# Patient Record
Sex: Male | Born: 1945 | Race: White | Hispanic: No | Marital: Married | Smoking: Former smoker
Health system: Southern US, Community
[De-identification: ages and names within clinical notes are randomized; demographics above are authoritative.]

## PROBLEM LIST (undated history)

## (undated) DIAGNOSIS — Z8673 Personal history of transient ischemic attack (TIA), and cerebral infarction without residual deficits: Secondary | ICD-10-CM

## (undated) DIAGNOSIS — I639 Cerebral infarction, unspecified: Secondary | ICD-10-CM

## (undated) DIAGNOSIS — I872 Venous insufficiency (chronic) (peripheral): Secondary | ICD-10-CM

## (undated) DIAGNOSIS — R011 Cardiac murmur, unspecified: Secondary | ICD-10-CM

## (undated) DIAGNOSIS — L309 Dermatitis, unspecified: Secondary | ICD-10-CM

## (undated) DIAGNOSIS — I491 Atrial premature depolarization: Secondary | ICD-10-CM

## (undated) DIAGNOSIS — E785 Hyperlipidemia, unspecified: Secondary | ICD-10-CM

## (undated) DIAGNOSIS — E119 Type 2 diabetes mellitus without complications: Secondary | ICD-10-CM

## (undated) DIAGNOSIS — I779 Disorder of arteries and arterioles, unspecified: Secondary | ICD-10-CM

## (undated) DIAGNOSIS — I10 Essential (primary) hypertension: Secondary | ICD-10-CM

## (undated) DIAGNOSIS — R609 Edema, unspecified: Secondary | ICD-10-CM

## (undated) DIAGNOSIS — G4700 Insomnia, unspecified: Secondary | ICD-10-CM

## (undated) HISTORY — DX: Cerebral infarction, unspecified: I63.9

## (undated) HISTORY — DX: Atrial premature depolarization: I49.1

## (undated) HISTORY — DX: Type 2 diabetes mellitus without complications: E11.9

## (undated) HISTORY — DX: Edema, unspecified: R60.9

## (undated) HISTORY — DX: Personal history of transient ischemic attack (TIA), and cerebral infarction without residual deficits: Z86.73

## (undated) HISTORY — DX: Dermatitis, unspecified: L30.9

## (undated) HISTORY — DX: Venous insufficiency (chronic) (peripheral): I87.2

## (undated) HISTORY — DX: Disorder of arteries and arterioles, unspecified: I77.9

## (undated) HISTORY — DX: Insomnia, unspecified: G47.00

## (undated) HISTORY — DX: Hyperlipidemia, unspecified: E78.5

## (undated) HISTORY — DX: Essential (primary) hypertension: I10

## (undated) HISTORY — DX: Cardiac murmur, unspecified: R01.1

---

## 1983-08-07 HISTORY — PX: GALLBLADDER SURGERY: SHX652

## 2013-09-10 ENCOUNTER — Encounter (HOSPITAL_COMMUNITY): Payer: Self-pay | Admitting: Emergency Medicine

## 2013-09-10 ENCOUNTER — Emergency Department (INDEPENDENT_AMBULATORY_CARE_PROVIDER_SITE_OTHER): Payer: Self-pay

## 2013-09-10 ENCOUNTER — Emergency Department (INDEPENDENT_AMBULATORY_CARE_PROVIDER_SITE_OTHER)
Admission: EM | Admit: 2013-09-10 | Discharge: 2013-09-10 | Disposition: A | Discharge status: Home / Self Care | Payer: Self-pay | Source: Home / Self Care | Attending: Family Medicine | Admitting: Family Medicine

## 2013-09-10 DIAGNOSIS — S139XXA Sprain of joints and ligaments of unspecified parts of neck, initial encounter: Secondary | ICD-10-CM

## 2013-09-10 DIAGNOSIS — S134XXA Sprain of ligaments of cervical spine, initial encounter: Secondary | ICD-10-CM

## 2013-09-10 MED ORDER — TRAMADOL HCL 50 MG PO TABS: 50.0000 mg | ORAL_TABLET | Freq: Four times a day (QID) | ORAL | Status: DC | PRN
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
## 2015-01-05 DIAGNOSIS — L309 Dermatitis, unspecified: Secondary | ICD-10-CM | POA: Insufficient documentation

## 2015-01-05 DIAGNOSIS — N138 Other obstructive and reflux uropathy: Secondary | ICD-10-CM | POA: Insufficient documentation

## 2015-01-05 DIAGNOSIS — I10 Essential (primary) hypertension: Secondary | ICD-10-CM | POA: Insufficient documentation

## 2015-01-05 DIAGNOSIS — E782 Mixed hyperlipidemia: Secondary | ICD-10-CM | POA: Insufficient documentation

## 2015-01-05 DIAGNOSIS — K219 Gastro-esophageal reflux disease without esophagitis: Secondary | ICD-10-CM | POA: Insufficient documentation

## 2015-01-05 DIAGNOSIS — R609 Edema, unspecified: Secondary | ICD-10-CM | POA: Insufficient documentation
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
## 2015-04-06 DIAGNOSIS — E782 Mixed hyperlipidemia: Secondary | ICD-10-CM | POA: Diagnosis not present

## 2015-04-06 DIAGNOSIS — E780 Pure hypercholesterolemia: Secondary | ICD-10-CM | POA: Diagnosis not present

## 2015-04-06 DIAGNOSIS — D519 Vitamin B12 deficiency anemia, unspecified: Secondary | ICD-10-CM | POA: Diagnosis not present

## 2015-04-06 DIAGNOSIS — R739 Hyperglycemia, unspecified: Secondary | ICD-10-CM | POA: Diagnosis not present

## 2015-04-06 DIAGNOSIS — I10 Essential (primary) hypertension: Secondary | ICD-10-CM | POA: Diagnosis not present

## 2015-04-06 DIAGNOSIS — I491 Atrial premature depolarization: Secondary | ICD-10-CM | POA: Diagnosis not present

## 2015-04-13 DIAGNOSIS — B356 Tinea cruris: Secondary | ICD-10-CM | POA: Diagnosis not present

## 2015-04-13 DIAGNOSIS — I491 Atrial premature depolarization: Secondary | ICD-10-CM | POA: Diagnosis not present

## 2015-04-13 DIAGNOSIS — E780 Pure hypercholesterolemia: Secondary | ICD-10-CM | POA: Diagnosis not present

## 2015-04-13 DIAGNOSIS — Z0000 Encounter for general adult medical examination without abnormal findings: Secondary | ICD-10-CM | POA: Diagnosis not present

## 2015-04-13 DIAGNOSIS — I10 Essential (primary) hypertension: Secondary | ICD-10-CM | POA: Diagnosis not present

## 2015-04-13 DIAGNOSIS — R739 Hyperglycemia, unspecified: Secondary | ICD-10-CM | POA: Diagnosis not present

## 2015-04-13 DIAGNOSIS — Z23 Encounter for immunization: Secondary | ICD-10-CM | POA: Diagnosis not present

## 2015-05-09 DIAGNOSIS — B86 Scabies: Secondary | ICD-10-CM | POA: Diagnosis not present

## 2015-05-19 DIAGNOSIS — L309 Dermatitis, unspecified: Secondary | ICD-10-CM | POA: Diagnosis not present

## 2015-07-21 DIAGNOSIS — L309 Dermatitis, unspecified: Secondary | ICD-10-CM | POA: Diagnosis not present

## 2015-07-21 DIAGNOSIS — L304 Erythema intertrigo: Secondary | ICD-10-CM | POA: Diagnosis not present

## 2017-02-13 ENCOUNTER — Encounter: Payer: Self-pay | Admitting: Cardiovascular Disease

## 2017-02-13 ENCOUNTER — Ambulatory Visit (INDEPENDENT_AMBULATORY_CARE_PROVIDER_SITE_OTHER): Payer: Medicare Other | Admitting: Cardiovascular Disease

## 2017-02-13 VITALS — BP 140/48 | HR 58 | Ht 73.0 in | Wt 168.0 lb

## 2017-02-13 DIAGNOSIS — E7800 Pure hypercholesterolemia, unspecified: Secondary | ICD-10-CM

## 2017-02-13 DIAGNOSIS — R600 Localized edema: Secondary | ICD-10-CM

## 2017-02-13 DIAGNOSIS — I10 Essential (primary) hypertension: Secondary | ICD-10-CM | POA: Diagnosis not present

## 2017-02-13 MED ORDER — CHLORTHALIDONE 25 MG PO TABS: 25.0000 mg | ORAL_TABLET | Freq: Every day | ORAL | 6 refills | Status: DC

## 2017-02-13 NOTE — Progress Notes (Signed)
CARDIOLOGY CONSULT NOTE  Patient ID: Marcus Carroll MRN: [id number] DOB/AGE: 05-02-1946 71 y.o.  Admit date: (Not on file) Primary Physician: No primary care provider on file. Referring Physician: Selinda Flavin, MD  Reason for Consultation: leg edema  HPI: Marcus Carroll is a 71 y.o. male who is being seen today for the evaluation of leg edema at the request of Selinda Flavin, MD.   He has a history of hypertension, hyperlipidemia, and diabetes. He was prescribed Lasix by his PCP.  Review of labs performed on 11/28/16 showed BUN 15, creatinine 0.76, albumin 4.1, sodium 138, potassium 4.2, N-terminal proBNP 166, hemoglobin 13.9.  Echocardiogram performed at an outside facility [facility]) on 11/01/15 demonstrated normal left ventricular systolic function, LVEF 55-60%, diastolic dysfunction, mild LVH, mild left atrial dilatation.  I personally reviewed an ECG performed on 10/19/15 which showed sinus rhythm with PACs, 59 bpm.  He began experiencing bilateral leg swelling in April of this year. He notices it if he sits for long periods of time driving or if he is up on his feet. 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 He walks his [place] Husky 3 or 4 times per day.   He denies exertional chest pain and dyspnea. He also denies palpitations, lightheadedness, dizziness, and syncope. He denies orthopnea and paroxysmal nocturnal dyspnea.  Social history: Married. He is originally from [place], [place]. His parents lived in [place] for a few years. He has a sister who lives in [place]. 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  Family history: No history of clotting disorders.   No Known Allergies  Current Outpatient Prescriptions  Medication Sig Dispense Refill  . atorvastatin (LIPITOR) 10 MG tablet Take 10 mg by mouth daily.    . furosemide (LASIX) 40 MG tablet Take 40 mg by mouth.    Marland Kitchen lisinopril (PRINIVIL,ZESTRIL) 5 MG tablet Take  5 mg by mouth daily.    Marland Kitchen loratadine-pseudoephedrine (CLARITIN-D 12-HOUR) 5-120 MG tablet Take 1 tablet by mouth 2 (two) times daily.    . metFORMIN (GLUCOPHAGE-XR) 500 MG 24 hr tablet Take 500 mg by mouth daily with breakfast.    . metoprolol succinate (TOPROL-XL) 25 MG 24 hr tablet Take 25 mg by mouth daily.    . Omega-3 Fatty Acids (FISH OIL) 1000 MG CAPS Take by mouth.    . sildenafil (VIAGRA) 100 MG tablet Take 100 mg by mouth daily as needed for erectile dysfunction.    Marland Kitchen terazosin (HYTRIN) 2 MG capsule Take 2 mg by mouth at bedtime.    . vitamin B-12 (CYANOCOBALAMIN) 100 MCG tablet Take 100 mcg by mouth daily.     No current facility-administered medications for this visit.     Past Medical History:  Diagnosis Date  . Atrial premature depolarization   . Eczema   . Edema   . Essential hypertension   . Heart murmur   . Insomnia     Past Surgical History:  Procedure Laterality Date  . GALLBLADDER SURGERY  1985    Social History   Social History  . Marital status: Married    Spouse name: N/A  . Number of children: N/A  . Years of education: N/A   Occupational History  . Not on file.   Social History Main Topics  . Smoking status: Former Smoker    Types: Cigarettes  . Smokeless tobacco: Never Used  . Alcohol use Not on file  . Drug use: Unknown  .  Sexual activity: Not on file   Other Topics Concern  . Not on file   Social History Narrative  . No narrative on file     No family history of premature CAD in 1st degree relatives.  Current Meds  Medication Sig  . atorvastatin (LIPITOR) 10 MG tablet Take 10 mg by mouth daily.  . furosemide (LASIX) 40 MG tablet Take 40 mg by mouth.  Marland Kitchen. lisinopril (PRINIVIL,ZESTRIL) 5 MG tablet Take 5 mg by mouth daily.  Marland Kitchen. loratadine-pseudoephedrine (CLARITIN-D 12-HOUR) 5-120 MG tablet Take 1 tablet by mouth 2 (two) times daily.  . metFORMIN (GLUCOPHAGE-XR) 500 MG 24 hr tablet Take 500 mg by mouth daily with breakfast.  .  metoprolol succinate (TOPROL-XL) 25 MG 24 hr tablet Take 25 mg by mouth daily.  . Omega-3 Fatty Acids (FISH OIL) 1000 MG CAPS Take by mouth.  . sildenafil (VIAGRA) 100 MG tablet Take 100 mg by mouth daily as needed for erectile dysfunction.  Marland Kitchen. terazosin (HYTRIN) 2 MG capsule Take 2 mg by mouth at bedtime.  . vitamin B-12 (CYANOCOBALAMIN) 100 MCG tablet Take 100 mcg by mouth daily.      Review of systems complete and found to be negative unless listed above in HPI    Physical exam Blood pressure (!) 140/48, pulse (!) 58, height 6\' 1"  (1.854 m), weight 168 lb (76.2 kg), SpO2 98 %. General: NAD Neck: No JVD, no thyromegaly or thyroid nodule.  Lungs: Clear to auscultation bilaterally with normal respiratory effort. CV: Nondisplaced PMI. Regular rate and rhythm, normal S1/S2, no S3/S4, no murmur.  Nonpitting pretibial and periankle edema bilaterally.  No carotid bruit.    Abdomen: Soft, nontender, no distention.  Skin: Mild stasis dermatitis. Neurologic: Alert and oriented x 3.  Psych: Normal affect. Extremities: No clubbing or cyanosis.  HEENT: Normal.   ECG: Most recent ECG reviewed.   Labs: No results found for: K, BUN, CREATININE, ALT, TSH, HGB   Lipids: No results found for: LDLCALC, LDLDIRECT, CHOL, TRIG, HDL      ASSESSMENT AND PLAN:  1. Bilateral leg edema: This appears to be due to venous insufficiency. He has been prescribed compression stockings by his PCP but he does not use them. I encouraged him to wear knee-high compression stockings throughout the day and to take them off at night. This first-line therapy for venous insufficiency. I will also switch antihypertensive therapy from metoprolol succinate to chlorthalidone 25 mg daily. As we'll also accomplish a diuretic effect. In order to rule out other causes, I will check a TSH and liver function tests.  2. Hypertension: Mildly elevated systolic blood pressure. I will discontinue metoprolol succinate and start  chlorthalidone 25 mg daily. I will check a basic metabolic panel in one week.  3. Hyperlipidemia: Continue Lipitor.   Disposition: Follow up in 3 months  Signed: Prentice DockerSuresh Koneswaran, M.D., F.A.C.C.  02/13/2017, 1:43 PM

## 2017-02-13 NOTE — Patient Instructions (Addendum)
Medication Instructions:   Stop Lasix (Furosemide).  Stop Toprol XL (Metoprolol Succ).  Begin Chlorthalidone 25mg  daily.  Continue all other medications.    Labwork:  BMET, TSH, LFT - orders given today.  Due in 1 week, around 02/20/2017.    Office will contact with results via phone or letter.    Testing/Procedures: none  Follow-Up: 3 months   Any Other Special Instructions Will Be Listed Below (If Applicable). Your physician recommends that you wear your compression stockings daily.   If you need a refill on your cardiac medications before your next appointment, please call your pharmacy.

## 2017-03-11 ENCOUNTER — Telehealth: Payer: Self-pay | Admitting: *Deleted

## 2017-03-11 NOTE — Telephone Encounter (Signed)
Notes recorded by Lesle ChrisHill, Angela G, LPN on 1/6/10968/01/2017 at 9:33 AM EDT Patient notified. No pmd listed.  Follow up already scheduled for 05-22-2017. ------  Notes recorded by Lesle ChrisHill, Angela G, LPN on 0/4/54098/08/2016 at 3:50 PM EDT Left message to return call.  ------  Notes recorded by Laqueta LindenKoneswaran, Suresh A, MD on 03/06/2017 at 9:29 AM EDT Normal.

## 2017-05-22 ENCOUNTER — Ambulatory Visit (INDEPENDENT_AMBULATORY_CARE_PROVIDER_SITE_OTHER): Payer: Medicare Other | Admitting: Cardiovascular Disease

## 2017-05-22 ENCOUNTER — Encounter: Payer: Self-pay | Admitting: Cardiovascular Disease

## 2017-05-22 VITALS — BP 148/82 | HR 81 | Ht 73.0 in | Wt 192.0 lb

## 2017-05-22 DIAGNOSIS — E7800 Pure hypercholesterolemia, unspecified: Secondary | ICD-10-CM

## 2017-05-22 DIAGNOSIS — R609 Edema, unspecified: Secondary | ICD-10-CM | POA: Diagnosis not present

## 2017-05-22 DIAGNOSIS — R600 Localized edema: Secondary | ICD-10-CM

## 2017-05-22 DIAGNOSIS — I10 Essential (primary) hypertension: Secondary | ICD-10-CM | POA: Diagnosis not present

## 2017-05-22 MED ORDER — LISINOPRIL 10 MG PO TABS: 10.0000 mg | ORAL_TABLET | Freq: Every day | ORAL | 6 refills | Status: DC

## 2017-05-22 MED ORDER — POTASSIUM CHLORIDE CRYS ER 20 MEQ PO TBCR: 20.0000 meq | EXTENDED_RELEASE_TABLET | Freq: Every day | ORAL | 6 refills | Status: DC

## 2017-05-22 NOTE — Patient Instructions (Addendum)
Medication Instructions:   Increase Lisinopril to 10mg  daily.  Begin Potassium 20meq daily.  Continue all other medications.    Labwork:  BMET, Magnesium - due in 2 weeks - order given today.  Office will contact with results via phone or letter.    Testing/Procedures: none  Follow-Up: Your physician wants you to follow up in: 6 months.  You will receive a reminder letter in the mail one-two months in advance.  If you don't receive a letter, please call our office to schedule the follow up appointment   Any Other Special Instructions Will Be Listed Below (If Applicable).  If you need a refill on your cardiac medications before your next appointment, please call your pharmacy.

## 2017-05-22 NOTE — Progress Notes (Signed)
SUBJECTIVE: The patient presents for follow-up of bilateral leg edema/chronic venous insufficiency. TSH and liver function tests were normal. I started chlorthalidone at his last visit.  He said "chlorthalidone is like a miracle drug". He is now able to do the things that he wants to do due to reduced leg swelling. He said his weight is stable at home. He has prediabetes and takes metformin. He does complain of leg cramps.     Social history: Married. He is originally from [place], [place]. His parents lived in [place] for a few years. He has a sister who lives in [place]. 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  Review of Systems: As per "subjective", otherwise negative.  No Known Allergies  Current Outpatient Prescriptions  Medication Sig Dispense Refill  . atorvastatin (LIPITOR) 10 MG tablet Take 10 mg by mouth daily.    . chlorthalidone (HYGROTON) 25 MG tablet Take 25 mg by mouth daily.    Marland Kitchen. lisinopril (PRINIVIL,ZESTRIL) 5 MG tablet Take 5 mg by mouth daily.    Marland Kitchen. loratadine-pseudoephedrine (CLARITIN-D 12-HOUR) 5-120 MG tablet Take 1 tablet by mouth 2 (two) times daily.    . metFORMIN (GLUCOPHAGE-XR) 500 MG 24 hr tablet Take 500 mg by mouth daily with breakfast.    . Omega-3 Fatty Acids (FISH OIL) 1000 MG CAPS Take by mouth.    . sildenafil (VIAGRA) 100 MG tablet Take 100 mg by mouth daily as needed for erectile dysfunction.    Marland Kitchen. terazosin (HYTRIN) 2 MG capsule Take 2 mg by mouth at bedtime.    . vitamin B-12 (CYANOCOBALAMIN) 100 MCG tablet Take 100 mcg by mouth daily.     No current facility-administered medications for this visit.     Past Medical History:  Diagnosis Date  . Atrial premature depolarization   . Eczema   . Edema   . Essential hypertension   . Heart murmur   . Insomnia     Past Surgical History:  Procedure Laterality Date  . GALLBLADDER SURGERY  1985    Social History   Social History  . Marital status:  Married    Spouse name: N/A  . Number of children: N/A  . Years of education: N/A   Occupational History  . Not on file.   Social History Main Topics  . Smoking status: Former Smoker    Types: Cigarettes  . Smokeless tobacco: Never Used  . Alcohol use Not on file  . Drug use: Unknown  . Sexual activity: Not on file   Other Topics Concern  . Not on file   Social History Narrative  . No narrative on file     Vitals:   05/22/17 1132  BP: (!) 148/82  Pulse: 81  SpO2: 98%  Weight: 192 lb (87.1 kg)  Height: 6\' 1"  (1.854 m)    Wt Readings from Last 3 Encounters:  05/22/17 192 lb (87.1 kg)  02/13/17 168 lb (76.2 kg)     PHYSICAL EXAM General: NAD HEENT: Normal. Neck: No JVD, no thyromegaly. Lungs: Clear to auscultation bilaterally with normal respiratory effort. CV: Nondisplaced PMI.  Regular rate and rhythm, normal S1/S2, no S3/S4, no murmur. Trace bilateral nonpitting periankle edema. Abdomen: Soft, nontender, no distention.  Neurologic: Alert and oriented.  Psych: Normal affect. Skin: Normal. Musculoskeletal: No gross deformities.    ECG: Most recent ECG reviewed.   Labs: No results found for: K, BUN, CREATININE, ALT, TSH, HGB   Lipids: No results found  for: LDLCALC, LDLDIRECT, CHOL, TRIG, HDL     ASSESSMENT AND PLAN:  1. Bilateral leg edema: This appears to be due to venous insufficiency. It has significantly resolved with the institution of chlorthalidone at his last visit with me. I encouraged him to wear knee-high compression stockings throughout the day and to take them off at night. This is first-line therapy for venous insufficiency. Due to leg cramps, I will start potassium 20 mEq daily and check a basic metabolic panel and magnesium level in 2 weeks.  2. Hypertension: Elevated. I will increase lisinopril to 10 mg daily.  3. Hyperlipidemia: Continue Lipitor.     Disposition: Follow up 6 months.   Prentice Docker, M.D., F.A.C.C.

## 2017-08-07 ENCOUNTER — Encounter: Payer: Self-pay | Admitting: *Deleted

## 2017-08-22 ENCOUNTER — Encounter: Payer: Self-pay | Admitting: *Deleted

## 2017-09-02 ENCOUNTER — Other Ambulatory Visit: Payer: Self-pay | Admitting: Cardiovascular Disease

## 2017-09-05 ENCOUNTER — Telehealth: Payer: Self-pay | Admitting: *Deleted

## 2017-09-05 NOTE — Telephone Encounter (Signed)
Notes recorded by Lesle ChrisHill, Angela G, LPN on 1/61/09601/31/2019 at 2:11 PM EST Patient notified. Copy to pmd. ------  Notes recorded by Lesle ChrisHill, Angela G, LPN on 4/54/09811/29/2019 at 9:23 AM EST Left message to return call.  ------  Notes recorded by Laqueta LindenKoneswaran, Suresh A, MD on 09/02/2017 at 12:16 PM EST Good.

## 2017-12-15 ENCOUNTER — Other Ambulatory Visit: Payer: Self-pay | Admitting: Cardiovascular Disease

## 2017-12-31 ENCOUNTER — Telehealth: Payer: Self-pay | Admitting: Cardiovascular Disease

## 2017-12-31 NOTE — Telephone Encounter (Signed)
Patient has been experiencing his ankles and legs swelling since heat has gotten worse.  Would like to know what he can do or what can be done

## 2017-12-31 NOTE — Telephone Encounter (Signed)
Returned call to patient - c/o ankles and up slightly.  No weight gain that he can tell.  No chest pain or SOB.  Bothering x last 1-2 weeks.  Seems to more noticeable with the extra heat lately.

## 2018-01-01 NOTE — Telephone Encounter (Signed)
Compression stockings and leg elevation for venous insufficiency.

## 2018-01-01 NOTE — Telephone Encounter (Signed)
Patient notified and verbalized understanding. 

## 2018-02-25 ENCOUNTER — Other Ambulatory Visit: Payer: Self-pay | Admitting: Cardiovascular Disease

## 2018-03-11 ENCOUNTER — Other Ambulatory Visit: Payer: Self-pay | Admitting: Cardiovascular Disease

## 2018-03-16 ENCOUNTER — Other Ambulatory Visit: Payer: Self-pay | Admitting: Cardiovascular Disease

## 2018-03-23 ENCOUNTER — Other Ambulatory Visit: Payer: Self-pay | Admitting: Cardiovascular Disease

## 2018-03-31 ENCOUNTER — Encounter: Payer: Self-pay | Admitting: Cardiovascular Disease

## 2018-03-31 ENCOUNTER — Ambulatory Visit: Payer: Medicare Other | Admitting: Cardiovascular Disease

## 2018-03-31 VITALS — BP 122/62 | HR 61 | Ht 73.0 in | Wt 202.0 lb

## 2018-03-31 DIAGNOSIS — E7800 Pure hypercholesterolemia, unspecified: Secondary | ICD-10-CM

## 2018-03-31 DIAGNOSIS — I10 Essential (primary) hypertension: Secondary | ICD-10-CM

## 2018-03-31 DIAGNOSIS — R600 Localized edema: Secondary | ICD-10-CM | POA: Diagnosis not present

## 2018-03-31 NOTE — Patient Instructions (Signed)
Medication Instructions:  Continue all current medications.  Labwork: none  Testing/Procedures: none  Follow-Up: Your physician wants you to follow up in:  1 year.  You will receive a reminder letter in the mail one-two months in advance.  If you don't receive a letter, please call our office to schedule the follow up appointment   Any Other Special Instructions Will Be Listed Below (If Applicable). Knee high compression stockings - order given today.   If you need a refill on your cardiac medications before your next appointment, please call your pharmacy.

## 2018-03-31 NOTE — Progress Notes (Signed)
SUBJECTIVE: The patient presents for follow-up of bilateral leg edema/chronic venous insufficiency.  He had not been bothered by leg swelling until the latter part of May when he was doing outdoor yard work.  He has also been eating out about half of the week and has increased consumption of hamburgers and barbecue.  He has not been wearing compression stockings.  He denies chest pain, palpitations, and shortness of breath.  ECG performed in the office today which I ordered and personally interpreted demonstrates normal sinus rhythm with PACs and septal Q waves.    Social history: Married. He is originally from [place], [place]. His parents lived in [place] for a few years. He has a sister who lives in [place]. 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  Review of Systems: As per "subjective", otherwise negative.  No Known Allergies  Current Outpatient Medications  Medication Sig Dispense Refill  . atorvastatin (LIPITOR) 10 MG tablet Take 10 mg by mouth daily.    . chlorthalidone (HYGROTON) 25 MG tablet Take 25 mg by mouth daily.    . chlorthalidone (HYGROTON) 25 MG tablet TAKE 1 TABLET BY MOUTH EVERY DAY 30 tablet 1  . Dupilumab, Asthma, (DUPIXENT [place]) Inject into the skin every 21 ( twenty-one) days.    Marland Kitchen KLOR-CON M20 20 MEQ tablet TAKE 1 TABLET BY MOUTH EVERY DAY 30 tablet 2  . lisinopril (PRINIVIL,ZESTRIL) 10 MG tablet TAKE 1 TABLET BY MOUTH EVERY DAY 90 tablet 0  . loratadine-pseudoephedrine (CLARITIN-D 12-HOUR) 5-120 MG tablet Take 1 tablet by mouth 2 (two) times daily.    . metFORMIN (GLUCOPHAGE-XR) 500 MG 24 hr tablet Take 500 mg by mouth daily with breakfast.    . Omega-3 Fatty Acids (FISH OIL) 1000 MG CAPS Take by mouth.    . sildenafil (VIAGRA) 100 MG tablet Take 100 mg by mouth daily as needed for erectile dysfunction.    Marland Kitchen terazosin (HYTRIN) 2 MG capsule Take 2 mg by mouth at bedtime.    . vitamin B-12 (CYANOCOBALAMIN) 100 MCG  tablet Take 100 mcg by mouth daily.     No current facility-administered medications for this visit.     Past Medical History:  Diagnosis Date  . Atrial premature depolarization   . Eczema   . Edema   . Essential hypertension   . Heart murmur   . Insomnia     Past Surgical History:  Procedure Laterality Date  . GALLBLADDER SURGERY  1985    Social History   Socioeconomic History  . Marital status: Married    Spouse name: Not on file  . Number of children: Not on file  . Years of education: Not on file  . Highest education level: Not on file  Occupational History  . Not on file  Social Needs  . Financial resource strain: Not on file  . Food insecurity:    Worry: Not on file    Inability: Not on file  . Transportation needs:    Medical: Not on file    Non-medical: Not on file  Tobacco Use  . Smoking status: Former Smoker    Types: Cigarettes  . Smokeless tobacco: Never Used  Substance and Sexual Activity  . Alcohol use: Not on file  . Drug use: Not on file  . Sexual activity: Not on file  Lifestyle  . Physical activity:    Days per week: Not on file    Minutes per session: Not on file  .  Stress: Not on file  Relationships  . Social connections:    Talks on phone: Not on file    Gets together: Not on file    Attends religious service: Not on file    Active member of club or organization: Not on file    Attends meetings of clubs or organizations: Not on file    Relationship status: Not on file  . Intimate partner violence:    Fear of current or ex partner: Not on file    Emotionally abused: Not on file    Physically abused: Not on file    Forced sexual activity: Not on file  Other Topics Concern  . Not on file  Social History Narrative  . Not on file     Vitals:   03/31/18 1122  BP: 122/62  Pulse: 61  SpO2: 98%  Weight: 202 lb (91.6 kg)  Height: 6\' 1"  (1.854 m)    Wt Readings from Last 3 Encounters:  03/31/18 202 lb (91.6 kg)  05/22/17 192  lb (87.1 kg)  02/13/17 168 lb (76.2 kg)     PHYSICAL EXAM General: NAD HEENT: Normal. Neck: No JVD, no thyromegaly. Lungs: Clear to auscultation bilaterally with normal respiratory effort. CV: Regular rate and mostly regular rhythm with premature contractions, normal S1/S2, no S3/S4, no murmur.  1+ pitting bilateral periankle edema. Abdomen: Soft, nontender, no distention.  Neurologic: Alert and oriented.  Psych: Normal affect. Skin: Normal. Musculoskeletal: No gross deformities.    ECG: Reviewed above under Subjective   Labs: No results found for: K, BUN, CREATININE, ALT, TSH, HGB   Lipids: No results found for: LDLCALC, LDLDIRECT, CHOL, TRIG, HDL     ASSESSMENT AND PLAN:  1. Bilateral leg edema: This appears to be due to venous insufficiency.  He takes chlorthalidone.   He has not been wearing compression stockings and has significantly increased sodium consumption.  We talked about the importance of reduced sodium consumption and wearing compression stockings.  I will prescribe knee-high compression stockings.  2. Hypertension:  Blood pressure is normal.  No changes to therapy.  3. Hyperlipidemia: Continue Lipitor.   Disposition: Follow up 1 year   Prentice DockerSuresh Daishaun Ayre, M.D., F.A.C.C.

## 2018-04-22 ENCOUNTER — Other Ambulatory Visit: Payer: Self-pay | Admitting: Cardiovascular Disease

## 2018-06-12 ENCOUNTER — Other Ambulatory Visit: Payer: Self-pay | Admitting: Cardiovascular Disease

## 2018-06-20 ENCOUNTER — Other Ambulatory Visit: Payer: Self-pay | Admitting: Cardiovascular Disease

## 2018-12-17 ENCOUNTER — Other Ambulatory Visit: Payer: Self-pay | Admitting: Cardiovascular Disease

## 2018-12-17 MED ORDER — POTASSIUM CHLORIDE CRYS ER 20 MEQ PO TBCR: 20.0000 meq | EXTENDED_RELEASE_TABLET | Freq: Every day | ORAL | 3 refills | Status: DC

## 2018-12-17 NOTE — Telephone Encounter (Signed)
Done

## 2018-12-17 NOTE — Telephone Encounter (Signed)
° ° ° °  1. Which medications need to be refilled? (please list name of each medication and dose if known)  KLOR-CON   20   2. Which pharmacy/location (including street and city if local pharmacy) is medication to be sent to? [facility]  [place], [place]   3. Do they need a 30 day or 90 day supply?90

## 2019-01-05 DIAGNOSIS — I679 Cerebrovascular disease, unspecified: Secondary | ICD-10-CM | POA: Insufficient documentation

## 2019-01-23 ENCOUNTER — Ambulatory Visit (INDEPENDENT_AMBULATORY_CARE_PROVIDER_SITE_OTHER): Payer: Medicare Other | Admitting: Vascular Surgery

## 2019-01-23 ENCOUNTER — Encounter: Payer: Self-pay | Admitting: Vascular Surgery

## 2019-01-23 ENCOUNTER — Other Ambulatory Visit: Payer: Self-pay

## 2019-01-23 VITALS — BP 128/73 | HR 62 | Temp 97.6°F | Resp 18 | Ht 73.0 in | Wt 197.0 lb

## 2019-01-23 DIAGNOSIS — I6523 Occlusion and stenosis of bilateral carotid arteries: Secondary | ICD-10-CM | POA: Diagnosis not present

## 2019-01-23 NOTE — Progress Notes (Signed)
Patient ID: Marcus ChambersJohn C Mendez, male   DOB: 03/11/1946, 73 y.o.   MRN: [id number]  Reason for Consult: No chief complaint on file.   Referred by Selinda FlavinHoward, Kevin, MD  Subjective:     HPI:  Marcus ChambersJohn C Melling is a 73 y.o. 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  At that [facility].  Already was taking aspirin was increased on the statin dose at the time.  Has not had any further symptoms.  Has no residual deficits.  Never had stroke before.  CT angios demonstrated stenosis of bilateral carotid arteries left greater than right.  Former smoker quit.  Other risk factors include hypertension.  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  Continues to walk 2 hours/day with his dog.  Does not have any claudication symptoms.  Past Medical History:  Diagnosis Date  . Atrial premature depolarization   . Eczema   . Edema   . Essential hypertension   . Heart murmur   . Insomnia    Family History  Problem Relation Age of Onset  . Heart murmur Father   . Heart attack Maternal Grandfather    Past Surgical History:  Procedure Laterality Date  . GALLBLADDER SURGERY  1985    Short Social History:  Social History   Tobacco Use  . Smoking status: Former Smoker    Types: Cigarettes  . Smokeless tobacco: Never Used  Substance Use Topics  . Alcohol use: Not on file    No Known Allergies  Current Outpatient Medications  Medication Sig Dispense Refill  . atorvastatin (LIPITOR) 10 MG tablet Take 10 mg by mouth daily.    . chlorthalidone (HYGROTON) 25 MG tablet Take 25 mg by mouth daily.    . chlorthalidone (HYGROTON) 25 MG tablet TAKE 1 TABLET BY MOUTH EVERY DAY 90 tablet 2  . Dupilumab, Asthma, (DUPIXENT ) Inject into the skin every 21 ( twenty-one) days.    Marland Kitchen. lisinopril (PRINIVIL,ZESTRIL) 10 MG tablet TAKE 1 TABLET BY MOUTH EVERY DAY 90 tablet 2  . loratadine-pseudoephedrine (CLARITIN-D  12-HOUR) 5-120 MG tablet Take 1 tablet by mouth 2 (two) times daily.    . metFORMIN (GLUCOPHAGE-XR) 500 MG 24 hr tablet Take 500 mg by mouth daily with breakfast.    . Omega-3 Fatty Acids (FISH OIL) 1000 MG CAPS Take by mouth.    . potassium chloride SA (KLOR-CON M20) 20 MEQ tablet Take 1 tablet (20 mEq total) by mouth daily. 90 tablet 3  . sildenafil (VIAGRA) 100 MG tablet Take 100 mg by mouth daily as needed for erectile dysfunction.    Marland Kitchen. terazosin (HYTRIN) 2 MG capsule Take 2 mg by mouth at bedtime.    . vitamin B-12 (CYANOCOBALAMIN) 100 MCG tablet Take 100 mcg by mouth daily.     No current facility-administered medications for this visit.     Review of Systems  Constitutional:  Constitutional negative. HENT: HENT negative.  Eyes: Eyes negative.  Respiratory: Respiratory negative.  Cardiovascular: Cardiovascular negative.  GI: Gastrointestinal negative.  Musculoskeletal: Musculoskeletal negative.  Skin: Skin negative.  Neurological: Neurological negative. Hematologic: Hematologic/lymphatic negative.  Psychiatric: Psychiatric negative.        Objective:   Vitals:   01/23/19 1107  BP: 128/73  Pulse: 62  Resp: 18  Temp: 97.6 F (36.4 C)  SpO2: 99%    Physical Exam Constitutional:  Appearance: Normal appearance.  HENT:     Head: Normocephalic.     Nose: Nose normal.  Eyes:     Pupils: Pupils are equal, round, and reactive to light.  Neck:     Musculoskeletal: Normal range of motion and neck supple.     Vascular: No carotid bruit.  Cardiovascular:     Rate and Rhythm: Normal rate.  Pulmonary:     Effort: Pulmonary effort is normal.  Abdominal:     General: Abdomen is flat.     Palpations: Abdomen is soft.  Musculoskeletal: Normal range of motion.        General: No swelling.  Skin:    General: Skin is warm.  Neurological:     General: No focal deficit present.     Mental Status: He is alert.  Psychiatric:        Mood and Affect: Mood normal.         Behavior: Behavior normal.        Thought Content: Thought content normal.        Judgment: Judgment normal.     Data: MR head       CTA neck    Assessment/Plan:     73 year old male with recent right MCA stroke.  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  Actually has more in the left side.  Currently taking aspirin and statin dose has been increased.  Does not need intervention for recent right stroke does not appear to be related to carotid.  I have discussed with him the need to continue his medications and follow-up with his cardiologist Dr. Bronson Ing.  He will follow-up in 1 year with repeat carotid duplex.     Waynetta Sandy MD [facility]

## 2019-02-12 ENCOUNTER — Other Ambulatory Visit: Payer: Self-pay | Admitting: Cardiovascular Disease

## 2019-03-19 ENCOUNTER — Other Ambulatory Visit: Payer: Self-pay

## 2019-03-19 MED ORDER — LISINOPRIL 10 MG PO TABS: 10.0000 mg | ORAL_TABLET | Freq: Every day | ORAL | 2 refills | Status: DC

## 2019-03-19 NOTE — Telephone Encounter (Signed)
refilled lisinopril 10 mg

## 2019-03-31 ENCOUNTER — Telehealth: Payer: Self-pay | Admitting: Cardiovascular Disease

## 2019-03-31 NOTE — Telephone Encounter (Signed)
Virtual Visit Pre-Appointment Phone Call  "(Name), I am calling you today to discuss your upcoming appointment. We are currently trying to limit exposure to the virus that causes COVID-19 by seeing patients at home rather than in the office."  1. "What is the BEST phone number to call the day of the visit?" - include this in appointment notes  2. Do you have or have access to (through a family member/friend) a smartphone with video capability that we can use for your visit?" a. If yes - list this number in appt notes as cell (if different from BEST phone #) and list the appointment type as a VIDEO visit in appointment notes b. If no - list the appointment type as a PHONE visit in appointment notes  3. Confirm consent - "In the setting of the current Covid19 crisis, you are scheduled for a (phone or video) visit with your provider on (date) at (time).  Just as we do with many in-office visits, in order for you to participate in this visit, we must obtain consent.  If you'd like, I can send this to your mychart (if signed up) or email for you to review.  Otherwise, I can obtain your verbal consent now.  All virtual visits are billed to your insurance company just like a normal visit would be.  By agreeing to a virtual visit, we'd like you to understand that the technology does not allow for your provider to perform an examination, and thus may limit your provider's ability to fully assess your condition. If your provider identifies any concerns that need to be evaluated in person, we will make arrangements to do so.  Finally, though the technology is pretty good, we cannot assure that it will always work on either your or our end, and in the setting of a video visit, we may have to convert it to a phone-only visit.  In either situation, we cannot ensure that we have a secure connection.  Are you willing to proceed?" STAFF: Did the patient verbally acknowledge consent to telehealth visit? Document  YES/NO here: yes  4. Advise patient to be prepared - "Two hours prior to your appointment, go ahead and check your blood pressure, pulse, oxygen saturation, and your weight (if you have the equipment to check those) and write them all down. When your visit starts, your provider will ask you for this information. If you have an Apple Watch or Kardia device, please plan to have heart rate information ready on the day of your appointment. Please have a pen and paper handy nearby the day of the visit as well."  5. Give patient instructions for MyChart download to smartphone OR Doximity/Doxy.me as below if video visit (depending on what platform provider is using)  6. Inform patient they will receive a phone call 15 minutes prior to their appointment time (may be from unknown caller ID) so they should be prepared to answer    TELEPHONE CALL NOTE  Marcus Carroll has been deemed a candidate for a follow-up tele-health visit to limit community exposure during the Covid-19 pandemic. I spoke with the patient via phone to ensure availability of phone/video source, confirm preferred email & phone number, and discuss instructions and expectations.  I reminded Marcus Carroll to be prepared with any vital sign and/or heart rhythm information that could potentially be obtained via home monitoring, at the time of his visit. I reminded Marcus Carroll to expect a phone call prior to  his visit.  Geraldine ContrasStephanie R Smith 03/31/2019 2:07 PM   INSTRUCTIONS FOR DOWNLOADING THE MYCHART APP TO SMARTPHONE  - The patient must first make sure to have activated MyChart and know their login information - If Apple, go to [employer] Store and type in MyChart in the search bar and download the app. If Android, ask patient to go to [employer] Play Store and type in [facility] in the search bar and download the app. The app is free but as with any other app downloads, their phone may require them to verify saved payment information or Apple/Android  password.  - The patient will need to then log into the app with their MyChart username and password, and select [facility] as their healthcare provider to link the account. When it is time for your visit, go to the MyChart app, find appointments, and click Begin Video Visit. Be sure to Select Allow for your device to access the Microphone and Camera for your visit. You will then be connected, and your provider will be with you shortly.  **If they have any issues connecting, or need assistance please contact MyChart service desk [phone])**  **If using a computer, in order to ensure the best quality for their visit they will need to use either of the following Internet Browsers: [employer] Edge, or Google Chrome**  IF USING DOXIMITY or DOXY.ME - The patient will receive a link just prior to their visit by text.     FULL LENGTH CONSENT FOR TELE-HEALTH VISIT   I hereby voluntarily request, consent and authorize [facility] and its employed or contracted physicians, physician assistants, nurse practitioners or other licensed health care professionals (the Practitioner), to provide me with telemedicine health care services (the Services") as deemed necessary by the treating Practitioner. I acknowledge and consent to receive the Services by the Practitioner via telemedicine. I understand that the telemedicine visit will involve communicating with the Practitioner through live audiovisual communication technology and the disclosure of certain medical information by electronic transmission. I acknowledge that I have been given the opportunity to request an in-person assessment or other available alternative prior to the telemedicine visit and am voluntarily participating in the telemedicine visit.  I understand that I have the right to withhold or withdraw my consent to the use of telemedicine in the course of my care at any time, without affecting my right to future care or treatment,  and that the Practitioner or I may terminate the telemedicine visit at any time. I understand that I have the right to inspect all information obtained and/or recorded in the course of the telemedicine visit and may receive copies of available information for a reasonable fee.  I understand that some of the potential risks of receiving the Services via telemedicine include:   Delay or interruption in medical evaluation due to technological equipment failure or disruption;  Information transmitted may not be sufficient (e.g. poor resolution of images) to allow for appropriate medical decision making by the Practitioner; and/or   In rare instances, security protocols could fail, causing a breach of personal health information.  Furthermore, I acknowledge that it is my responsibility to provide information about my medical history, conditions and care that is complete and accurate to the best of my ability. I acknowledge that Practitioner's advice, recommendations, and/or decision may be based on factors not within their control, such as incomplete or inaccurate data provided by me or distortions of diagnostic images or specimens that may result from electronic transmissions. I  understand that the practice of medicine is not an exact science and that Practitioner makes no warranties or guarantees regarding treatment outcomes. I acknowledge that I will receive a copy of this consent concurrently upon execution via email to the email address I last provided but may also request a printed copy by calling the office of [place].    I understand that my insurance will be billed for this visit.   I have read or had this consent read to me.  I understand the contents of this consent, which adequately explains the benefits and risks of the Services being provided via telemedicine.   I have been provided ample opportunity to ask questions regarding this consent and the Services and have had my questions  answered to my satisfaction.  I give my informed consent for the services to be provided through the use of telemedicine in my medical care  By participating in this telemedicine visit I agree to the above.

## 2019-04-02 ENCOUNTER — Telehealth (INDEPENDENT_AMBULATORY_CARE_PROVIDER_SITE_OTHER): Payer: Medicare Other | Admitting: Cardiovascular Disease

## 2019-04-02 ENCOUNTER — Encounter: Payer: Self-pay | Admitting: Cardiovascular Disease

## 2019-04-02 VITALS — BP 125/65 | HR 53 | Wt 194.0 lb

## 2019-04-02 DIAGNOSIS — I6523 Occlusion and stenosis of bilateral carotid arteries: Secondary | ICD-10-CM

## 2019-04-02 DIAGNOSIS — E785 Hyperlipidemia, unspecified: Secondary | ICD-10-CM

## 2019-04-02 DIAGNOSIS — I10 Essential (primary) hypertension: Secondary | ICD-10-CM

## 2019-04-02 DIAGNOSIS — R600 Localized edema: Secondary | ICD-10-CM

## 2019-04-02 DIAGNOSIS — Z8673 Personal history of transient ischemic attack (TIA), and cerebral infarction without residual deficits: Secondary | ICD-10-CM

## 2019-04-02 NOTE — Progress Notes (Signed)
Virtual Visit via Telephone Note   This visit type was conducted due to national recommendations for restrictions regarding the COVID-19 Pandemic (e.g. social distancing) in an effort to limit this patient's exposure and mitigate transmission in our community.  Due to his co-morbid illnesses, this patient is at least at moderate risk for complications without adequate follow up.  This format is felt to be most appropriate for this patient at this time.  The patient did not have access to video technology/had technical difficulties with video requiring transitioning to audio format only (telephone).  All issues noted in this document were discussed and addressed.  No physical exam could be performed with this format.  Please refer to the patient's chart for his  consent to telehealth for [facility].   Date:  04/02/2019   ID:  Marcus ChambersJohn C Carroll, DOB August 26, 1945, MRN [id number]  Patient Location: Home Provider Location: Office  PCP:  Estanislado PandySasser, Paul W, MD  Cardiologist:  [facility] DockerSuresh Koneswaran, MD  Electrophysiologist:  None   Evaluation Performed:  Follow-Up Visit  Chief Complaint:  Bilateral leg edema/chronic venous insufficiency.  History of Present Illness:    Marcus Carroll is a 73 y.o. male with bilateral leg edema/chronic venous insufficiency.  He has been teaching through virtual classes.  On March 3 he sustained a stroke.  MRI of the head showed an acute infarct in the posterior right MCA territory.  CTA of the neck showed approximately 60% stenosis in the left proximal internal carotid artery.  The right proximal sternal carotid artery had less than 50% stenosis.  He was evaluated by vascular surgery in June.  He has questions about what he can do to prevent another stroke.  He denies chest pain, palpitations, shortness of breath.  He has some mild ankle swelling at the end of the day.  The patient does not have symptoms concerning for COVID-19 infection (fever, chills, cough, or new  shortness of breath).   Social history: Married. He is originally from [place], [place]. His parents lived in [place] for a few years. He has a sister who lives in [place]. 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   Past Medical History:  Diagnosis Date  . Atrial premature depolarization   . Diabetes mellitus without complication (HCC)   . Eczema   . Edema   . Essential hypertension   . Heart murmur   . Hyperlipidemia   . Insomnia   . Stroke [facility](HCC)    Past Surgical History:  Procedure Laterality Date  . GALLBLADDER SURGERY  1985     Current Meds  Medication Sig  . aspirin EC 81 MG tablet Take 81 mg by mouth daily.  Marland Kitchen. atorvastatin (LIPITOR) 40 MG tablet Take 40 mg by mouth daily.  . chlorthalidone (HYGROTON) 25 MG tablet TAKE 1 TABLET BY MOUTH EVERY DAY  . [facility] OMEPRAZOLE 20 MG TBEC Take 1 tablet by mouth daily.  . Dupilumab, Asthma, (DUPIXENT [place]) Inject into the skin every 21 ( twenty-one) days.  Marland Kitchen. lisinopril (ZESTRIL) 10 MG tablet Take 1 tablet (10 mg total) by mouth daily.  . metFORMIN (GLUCOPHAGE-XR) 500 MG 24 hr tablet Take 500 mg by mouth daily with breakfast.  . potassium chloride SA (KLOR-CON M20) 20 MEQ tablet Take 1 tablet (20 mEq total) by mouth daily.  Marland Kitchen. terazosin (HYTRIN) 2 MG capsule Take 2 mg by mouth at bedtime.     Allergies:   Patient has no known allergies.   Social History  Tobacco Use  . Smoking status: Former Smoker    Types: Cigarettes  . Smokeless tobacco: Never Used  Substance Use Topics  . Alcohol use: Yes    Frequency: Never  . Drug use: Never     Family Hx: The patient's family history includes Heart attack in his maternal grandfather; Heart murmur in his father.  ROS:   Please see the history of present illness.     All other systems reviewed and are negative.   Prior CV studies:   The following studies were reviewed today:  MR head       CTA neck   Labs/Other Tests and Data Reviewed:     EKG:  No ECG reviewed.  Recent Labs: No results found for requested labs within last 8760 hours.   Recent Lipid Panel No results found for: CHOL, TRIG, HDL, CHOLHDL, LDLCALC, LDLDIRECT  Wt Readings from Last 3 Encounters:  04/02/19 194 lb (88 kg)  01/23/19 197 lb (89.4 kg)  03/31/18 202 lb (91.6 kg)     Objective:    Vital Signs:  BP 125/65   Pulse (!) 53   Wt 194 lb (88 kg)   BMI 25.60 kg/m    VITAL SIGNS:  reviewed  ASSESSMENT & PLAN:    1. Bilateral leg edema: This is due to venous insufficiency.  He takes chlorthalidone. He only has some mild ankle swelling at present.  We previously talked about the importance of reduced sodium consumption and wearing compression stockings.  I prescribed knee-high compression stockings.  2. Hypertension: Blood pressure is normal.  No changes to therapy.  3. Hyperlipidemia: Continue Lipitor.  4.  History of CVA: Currently on aspirin and statin.  Blood pressures controlled.  He has bilateral carotid artery stenosis and follows with vascular surgery.  I educated him on secondary prevention with respect to dietary and exercise modification.  5.  Bilateral carotid artery stenosis: Follows with vascular surgery.  On aspirin and statin.   COVID-19 Education: The signs and symptoms of COVID-19 were discussed with the patient and how to seek care for testing (follow up with PCP or arrange E-visit).  The importance of social distancing was discussed today.  Time:   Today, I have spent 10 minutes with the patient with telehealth technology discussing the above problems.     Medication Adjustments/Labs and Tests Ordered: Current medicines are reviewed at length with the patient today.  Concerns regarding medicines are outlined above.   Tests Ordered: No orders of the defined types were placed in this encounter.   Medication Changes: No orders of the defined types were placed in this encounter.   Follow Up:  Virtual Visit or In  Person prn  Signed, Kate Sable, MD  04/02/2019 9:16 AM    [place]

## 2019-04-02 NOTE — Patient Instructions (Signed)
Medication Instructions:  Continue all current medications.  Labwork: none  Testing/Procedures: none  Follow-Up: As needed.    Any Other Special Instructions Will Be Listed Below (If Applicable).  If you need a refill on your cardiac medications before your next appointment, please call your pharmacy.  

## 2019-08-31 DIAGNOSIS — L309 Dermatitis, unspecified: Secondary | ICD-10-CM | POA: Diagnosis not present

## 2019-09-16 DIAGNOSIS — E1159 Type 2 diabetes mellitus with other circulatory complications: Secondary | ICD-10-CM | POA: Diagnosis not present

## 2019-09-16 DIAGNOSIS — Z6826 Body mass index (BMI) 26.0-26.9, adult: Secondary | ICD-10-CM | POA: Diagnosis not present

## 2019-09-16 DIAGNOSIS — E782 Mixed hyperlipidemia: Secondary | ICD-10-CM | POA: Diagnosis not present

## 2019-09-16 DIAGNOSIS — K219 Gastro-esophageal reflux disease without esophagitis: Secondary | ICD-10-CM | POA: Diagnosis not present

## 2019-09-16 DIAGNOSIS — I6930 Unspecified sequelae of cerebral infarction: Secondary | ICD-10-CM | POA: Diagnosis not present

## 2019-09-16 DIAGNOSIS — E119 Type 2 diabetes mellitus without complications: Secondary | ICD-10-CM | POA: Diagnosis not present

## 2019-09-16 DIAGNOSIS — I10 Essential (primary) hypertension: Secondary | ICD-10-CM | POA: Diagnosis not present

## 2019-09-18 ENCOUNTER — Ambulatory Visit: Payer: Medicare Other

## 2019-10-26 ENCOUNTER — Other Ambulatory Visit: Payer: Self-pay | Admitting: *Deleted

## 2019-10-26 MED ORDER — DUPIXENT 300 MG/2ML ~~LOC~~ SOSY: 300.0000 mg | PREFILLED_SYRINGE | SUBCUTANEOUS | 10 refills | Status: DC

## 2019-10-26 NOTE — Telephone Encounter (Signed)
Received fax from [facility] for refill on dupixent

## 2019-11-11 ENCOUNTER — Other Ambulatory Visit: Payer: Self-pay | Admitting: Cardiovascular Disease

## 2019-11-17 DIAGNOSIS — R972 Elevated prostate specific antigen [PSA]: Secondary | ICD-10-CM | POA: Diagnosis not present

## 2019-11-24 DIAGNOSIS — N5201 Erectile dysfunction due to arterial insufficiency: Secondary | ICD-10-CM | POA: Diagnosis not present

## 2019-11-24 DIAGNOSIS — R3911 Hesitancy of micturition: Secondary | ICD-10-CM | POA: Diagnosis not present

## 2019-11-24 DIAGNOSIS — R972 Elevated prostate specific antigen [PSA]: Secondary | ICD-10-CM | POA: Diagnosis not present

## 2019-11-25 ENCOUNTER — Other Ambulatory Visit: Payer: Self-pay | Admitting: Family Medicine

## 2019-12-02 ENCOUNTER — Other Ambulatory Visit: Payer: Self-pay | Admitting: Cardiovascular Disease

## 2019-12-02 MED ORDER — CHLORTHALIDONE 25 MG PO TABS: 25.0000 mg | ORAL_TABLET | Freq: Every day | ORAL | 0 refills | Status: DC

## 2019-12-02 NOTE — Telephone Encounter (Signed)
*  STAT* If patient is at the pharmacy, call can be transferred to refill team.   1. Which medications need to be refilled? chlorthalidone (HYGROTON) 25 MG tablet    2. Which pharmacy/location (including street and city if local pharmacy) is medication to be sent to[facility] [place]  3. Do they need a 30 day or 90 day supply?

## 2019-12-07 ENCOUNTER — Ambulatory Visit: Payer: Medicare Other | Admitting: Family Medicine

## 2019-12-07 ENCOUNTER — Other Ambulatory Visit: Payer: Self-pay

## 2019-12-07 ENCOUNTER — Encounter: Payer: Self-pay | Admitting: Family Medicine

## 2019-12-07 VITALS — BP 118/54 | HR 70 | Ht 73.0 in | Wt 197.0 lb

## 2019-12-07 DIAGNOSIS — E785 Hyperlipidemia, unspecified: Secondary | ICD-10-CM

## 2019-12-07 DIAGNOSIS — R600 Localized edema: Secondary | ICD-10-CM | POA: Diagnosis not present

## 2019-12-07 DIAGNOSIS — I10 Essential (primary) hypertension: Secondary | ICD-10-CM | POA: Diagnosis not present

## 2019-12-07 DIAGNOSIS — I6523 Occlusion and stenosis of bilateral carotid arteries: Secondary | ICD-10-CM

## 2019-12-07 MED ORDER — CHLORTHALIDONE 25 MG PO TABS: 25.0000 mg | ORAL_TABLET | Freq: Every day | ORAL | 3 refills | Status: DC

## 2019-12-07 MED ORDER — POTASSIUM CHLORIDE CRYS ER 20 MEQ PO TBCR: 20.0000 meq | EXTENDED_RELEASE_TABLET | Freq: Every day | ORAL | 3 refills | Status: DC

## 2019-12-07 NOTE — Patient Instructions (Signed)

## 2019-12-07 NOTE — Progress Notes (Signed)
Cardiology Office Note  Date: 12/07/2019   ID: Marcus Carroll, DOB [birth year], MRN [id number]  PCP:  Marcus Pandy, MD  Cardiologist:  Prentice Docker, MD Electrophysiologist:  None   Chief Complaint: Follow-up bilateral leg edema / chronic venous insufficiency  History of Present Illness: Marcus Carroll is a 74 y.o. male with a history of  bilateral leg edema/chronic venous insufficiency.  History of CVA with acute infarct in the posterior right MCA territory.  CTA of neck showed approximately 60% stenosis in the left proximal internal carotid artery.  The right proximal internal carotid artery had less than 50% stenosis.  Vascular surgery evaluated him in June 2020.  Last saw Dr. Purvis Sheffield via telemedicine April 02, 2019.  He was taking chlorthalidone and only had mild ankle swelling at that visit.  Reducing sodium consumption and wearing compression stockings was discussed.  His blood pressure was normal.  He is continuing his statin therapy.  In reference to CVA he was continuing his aspirin and statin.  He continued to follow with vascular surgery for bilateral carotid artery stenosis  Patient states he has been doing well from a cardiac standpoint.  He denies any recent acute illnesses, hospitalizations.  States he has had both Covid vaccines.  States he is staying active by walking his dog 2 to 3 miles each day in addition to gardening at home.  He denies any progressive anginal or exertional symptoms, palpitations or arrhythmias, orthostatic symptoms, CVA or TIA-like symptoms, bleeding issues, claudication-like issues but does complain of cramping in his legs mostly at night.  He has some mild lower extremity edema secondary to chronic venous insufficiency.  He continues on chlorthalidone and lisinopril for lower extremity edema and hypertension, with potassium supplementation.  He is normotensive today.  Past Medical History:  Diagnosis Date  . Atrial premature depolarization   .  Diabetes mellitus without complication (HCC)   . Eczema   . Edema   . Essential hypertension   . Heart murmur   . Hyperlipidemia   . Insomnia   . Stroke [facility])     Past Surgical History:  Procedure Laterality Date  . GALLBLADDER SURGERY  1985    Current Outpatient Medications  Medication Sig Dispense Refill  . aspirin EC 81 MG tablet Take 81 mg by mouth daily.    Marland Kitchen atorvastatin (LIPITOR) 40 MG tablet Take 40 mg by mouth daily.    . chlorthalidone (HYGROTON) 25 MG tablet Take 1 tablet (25 mg total) by mouth daily. 90 tablet 3  . CVS OMEPRAZOLE 20 MG TBEC Take 1 tablet by mouth daily.    . DUPIXENT 300 MG/2ML prefilled syringe Inject 300 mg into the skin every 14 (fourteen) days. 3.92 mL 10  . lisinopril (ZESTRIL) 10 MG tablet Take 1 tablet (10 mg total) by mouth daily. 90 tablet 2  . metFORMIN (GLUCOPHAGE-XR) 500 MG 24 hr tablet Take 500 mg by mouth daily with breakfast.    . potassium chloride SA (KLOR-CON M20) 20 MEQ tablet Take 1 tablet (20 mEq total) by mouth daily. 90 tablet 3  . terazosin (HYTRIN) 2 MG capsule Take 2 mg by mouth at bedtime.     No current facility-administered medications for this visit.   Allergies:  Patient has no known allergies.   Social History: The patient  reports that he has quit smoking. His smoking use included cigarettes. He has never used smokeless tobacco. He reports current alcohol use. He reports that he does not use  drugs.   Family History: The patient's family history includes Heart attack in his maternal grandfather; Heart murmur in his father.   ROS:  Please see the history of present illness. Otherwise, complete review of systems is positive for none.  All other systems are reviewed and negative.   Physical Exam: VS:  BP (!) 118/54   Pulse 70   Ht 6\' 1"  (1.854 m)   Wt 197 lb (89.4 kg)   SpO2 98%   BMI 25.99 kg/m , BMI Body mass index is 25.99 kg/m.  Wt Readings from Last 3 Encounters:  12/07/19 197 lb (89.4 kg)  04/02/19 194 lb  (88 kg)  01/23/19 197 lb (89.4 kg)    General: Patient appears comfortable at rest. Neck: Supple, no elevated JVP or carotid bruits, no thyromegaly. Lungs: Clear to auscultation, nonlabored breathing at rest. Cardiac: Regular rate and rhythm, no S3 or significant systolic murmur, no pericardial rub. Abdomen: Soft, nontender, no hepatomegaly, bowel sounds present, no guarding or rebound. Extremities: Chronic mild bilateral LE pitting edema, distal pulses 2+. Skin: Warm and dry. Musculoskeletal: No kyphosis. Neuropsychiatric: Alert and oriented x3, affect grossly appropriate.  ECG:  An ECG dated 12/07/2019. was personally reviewed today and demonstrated:  Normal sinus rhythm rate of 64  Recent Labwork: No results found for requested labs within last 8760 hours.  No results found for: CHOL, TRIG, HDL, CHOLHDL, VLDL, LDLCALC, LDLDIRECT  Other Studies Reviewed Today: MR head   CTA neck    Assessment and Plan:  1. Bilateral leg edema   2. Essential hypertension   3. Hyperlipidemia LDL goal <70   4. Bilateral carotid artery stenosis    1. Bilateral leg edema Continues with chronic lower extremity edema secondary to chronic venous insufficiency.  He continues to take chlorthalidone which appears to manage the lower extremity edema.  He states the edema looks better than previous visits.  He does admit to some crampy leg pain at night.  Suggested he follow-up with primary care provider to check a BMP and magnesium.  Continue chlorthalidone 25 mg p.o. daily  2. Essential hypertension He is normotensive today with a blood pressure of 118/54.  Continue lisinopril 10 mg p.o. daily  3. Hyperlipidemia LDL goal <70 We have no recent labs work from PCP.  We will attempt to obtain recent lab work to check lipid profile.  Continue atorvastatin 40 mg p.o. daily.  4. Bilateral carotid artery stenosis There is a notation in the chart of patient having previous carotid artery studies.  There  is no data attached to the studies.  Patient had left proximal ICA moderate approximately 60% stenosis with mixed plaque.  Right proximal ICA mild less than 50% stenosis with mixed plaque.  Left vertebral moderate 50 to 70% stenosis with calcified plaque.  There were no carotid bruits heard on exam today.  May need to repeat this study at next follow-up  Medication Adjustments/Labs and Tests Ordered: Current medicines are reviewed at length with the patient today.  Concerns regarding medicines are outlined above.   Disposition: Follow-up with Dr. Bronson Ing or APP 1 year  Signed, Levell July, NP 12/07/2019 2:16 PM    [facility] at [place], [place], [place] [address] Phone: [phone]; Fax: [phone]

## 2019-12-10 ENCOUNTER — Other Ambulatory Visit: Payer: Self-pay | Admitting: Cardiovascular Disease

## 2019-12-14 ENCOUNTER — Other Ambulatory Visit: Payer: Self-pay | Admitting: Cardiovascular Disease

## 2020-01-25 ENCOUNTER — Other Ambulatory Visit: Payer: Self-pay | Admitting: *Deleted

## 2020-01-25 DIAGNOSIS — I6523 Occlusion and stenosis of bilateral carotid arteries: Secondary | ICD-10-CM

## 2020-02-03 ENCOUNTER — Ambulatory Visit (INDEPENDENT_AMBULATORY_CARE_PROVIDER_SITE_OTHER): Payer: Medicare PPO | Admitting: Physician Assistant

## 2020-02-03 ENCOUNTER — Other Ambulatory Visit: Payer: Self-pay

## 2020-02-03 ENCOUNTER — Ambulatory Visit (HOSPITAL_COMMUNITY)
Admission: RE | Admit: 2020-02-03 | Discharge: 2020-02-03 | Disposition: A | Discharge status: Home / Self Care | Payer: Medicare PPO | Source: Ambulatory Visit | Attending: Vascular Surgery | Admitting: Vascular Surgery

## 2020-02-03 VITALS — BP 132/65 | HR 58 | Resp 18 | Ht 73.0 in | Wt 192.0 lb

## 2020-02-03 DIAGNOSIS — I6523 Occlusion and stenosis of bilateral carotid arteries: Secondary | ICD-10-CM | POA: Diagnosis not present

## 2020-02-03 NOTE — Progress Notes (Addendum)
Office Note     CC:  follow up Requesting Provider:  Estanislado Pandy, MD  HPI: Marcus Carroll is a 74 y.o. ([birth year]) male who presents for routine surveillance of bilateral carotid artery stenosis.  The patient was seen 1 year ago by Dr. Randie Heinz.  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  Outside imaging studies from [facility] reviewed at that time. Per Dr. Randie Heinz,  "he had 50% stenosis in the right ICA.  Actually has more on the left side.  Currently taking aspirin and statin dose has been increased.  Does not need intervention for recent right stroke does not appear to be related to carotid.  I have discussed with him the need to continue his medications and follow-up with his cardiologist Dr. Purvis Sheffield.  He will follow-up in 1 year with repeat carotid duplex."  He presents today for his surveillance 1 year follow up with carotid duplex. He says overall he has been doing well since his last visit.  His only complaint today is some unsteadiness on ambulation the past couple of months. He says he is unsure if this is medication related, vascular related or normal age related change. 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 He denies dizziness, amaurosis fugax, facial drooping, slurred speech, headaches, confusion, weakness or numbness of upper or lower extremities.   He denies any lower extremity claudication, rest pain or non healing wounds. He does have some itching on dorsum of both of his feet. He has history of eczema. He gets monthly injections for this with improvement however was wondering if the itching was more of a vascular dermatitis. He denies any rashes. He also gets swelling and a mild redness of his legs but not feet. This is improved with chlorthalidone and elevation. He otherwise does not have any visible varicose veins and denies any aching, heaviness, throbbing, or ulcerations  Past medical history and  medications reviewed.  Patient is compliant with aspirin 81 mg, atorvastatin. Patient is a former smoker.  He takes an ACE inhibitor and diuretic for hypertension and is on Metformin for diabetes mellitus.  Past Medical History:  Diagnosis Date  . Atrial premature depolarization   . Diabetes mellitus without complication (HCC)   . Eczema   . Edema   . Essential hypertension   . Heart murmur   . Hyperlipidemia   . Insomnia   . Stroke [facility])     Past Surgical History:  Procedure Laterality Date  . GALLBLADDER SURGERY  1985    Social History   Socioeconomic History  . Marital status: Married    Spouse name: Not on file  . Number of children: Not on file  . Years of education: Not on file  . Highest education level: Not on file  Occupational History  . Not on file  Tobacco Use  . Smoking status: Former Smoker    Types: Cigarettes  . Smokeless tobacco: Never Used  Vaping Use  . Vaping Use: Never used  Substance and Sexual Activity  . Alcohol use: Yes  . Drug use: Never  . Sexual activity: Not on file  Other Topics Concern  . Not on file  Social History Narrative  . Not on file   Social Determinants of Health   Financial Resource Strain:   . Difficulty of Paying Living Expenses:   Food Insecurity:   . Worried About Programme researcher, broadcasting/film/video in the Last  Year:   . Ran Out of Food in the Last Year:   Transportation Needs:   . Freight forwarder (Medical):   Marland Kitchen Lack of Transportation (Non-Medical):   Physical Activity:   . Days of Exercise per Week:   . Minutes of Exercise per Session:   Stress:   . Feeling of Stress :   Social Connections:   . Frequency of Communication with Friends and Family:   . Frequency of Social Gatherings with Friends and Family:   . Attends Religious Services:   . Active Member of Clubs or Organizations:   . Attends Banker Meetings:   Marland Kitchen Marital Status:   Intimate Partner Violence:   . Fear of Current or Ex-Partner:   .  Emotionally Abused:   Marland Kitchen Physically Abused:   . Sexually Abused:    Family History  Problem Relation Age of Onset  . Heart murmur Father   . Heart attack Maternal Grandfather     Current Outpatient Medications  Medication Sig Dispense Refill  . aspirin EC 81 MG tablet Take 81 mg by mouth daily.    Marland Kitchen atorvastatin (LIPITOR) 40 MG tablet Take 40 mg by mouth daily.    . chlorthalidone (HYGROTON) 25 MG tablet Take 1 tablet (25 mg total) by mouth daily. 90 tablet 3  . [facility] OMEPRAZOLE 20 MG TBEC Take 1 tablet by mouth daily.    . DUPIXENT 300 MG/2ML prefilled syringe Inject 300 mg into the skin every 14 (fourteen) days. 3.92 mL 10  . lisinopril (ZESTRIL) 10 MG tablet TAKE 1 TABLET BY MOUTH EVERY DAY 90 tablet 2  . metFORMIN (GLUCOPHAGE-XR) 500 MG 24 hr tablet Take 500 mg by mouth daily with breakfast.    . potassium chloride SA (KLOR-CON M20) 20 MEQ tablet Take 1 tablet (20 mEq total) by mouth daily. 90 tablet 3  . terazosin (HYTRIN) 2 MG capsule Take 2 mg by mouth at bedtime.     No current facility-administered medications for this visit.    No Known Allergies   REVIEW OF SYSTEMS:   [X]  denotes positive finding, [ ]  denotes negative finding Cardiac  Comments:  Chest pain or chest pressure:    Shortness of breath upon exertion:    Short of breath when lying flat:    Irregular heart rhythm:        Vascular    Pain in calf, thigh, or hip brought on by ambulation:    Pain in feet at night that wakes you up from your sleep:     Blood clot in your veins:    Leg swelling:  x       Pulmonary    Oxygen at home:    Productive cough:     Wheezing:         Neurologic    Sudden weakness in arms or legs:     Sudden numbness in arms or legs:     Sudden onset of difficulty speaking or slurred speech:    Temporary loss of vision in one eye:     Problems with dizziness:         Gastrointestinal    Blood in stool:     Vomited blood:         Genitourinary    Burning when urinating:      Blood in urine:        Psychiatric    Major depression:         Hematologic    Bleeding  problems:    Problems with blood clotting too easily:        Skin    Rashes or ulcers:        Constitutional    Fever or chills:      PHYSICAL EXAMINATION:  Vitals:   02/03/20 1601 02/03/20 1605  BP: 122/67 132/65  Pulse: (!) 59 (!) 58  Resp: 18   SpO2: 98%     General:  Very pleasant elderly male, not in any distress, well nourished; vital signs documented above Gait: Walks unaided, no ataxia HENT: WNL, normocephalic Pulmonary: normal non-labored breathing , without Rales, rhonchi,  wheezing Cardiac: regular HR, without  Murmurs without carotid bruit Abdomen: soft, NT, no masses Skin: without rashes Vascular Exam/Pulses:2+ femoral, popliteal, DP and PT pulses bilaterally. 2+ radial pulses bilaterally Extremities: without ischemic changes, without Gangrene , without cellulitis; without open wounds; mild swelling of bilateral legs Musculoskeletal: no muscle wasting or atrophy  Neurologic: A&O X 3;  No focal weakness or paresthesias are detected Psychiatric:  The pt has Normal affect.   Non-Invasive Vascular Imaging:   Summary:  Right Carotid: Velocities in the right ICA are consistent with a 1-39%  stenosis. Non-hemodynamically significant plaque <50% noted in the  CCA.   Left Carotid: Velocities in the left ICA are consistent with a 1-39%  stenosis.   Vertebrals: Bilateral vertebral arteries demonstrate antegrade flow.  Subclavians: Normal flow hemodynamics were seen in bilateral subclavian arteries.   ASSESSMENT/PLAN:: 74 y.o. male here for follow up for bilateral carotid artery stenosis with history of right MCA stroke in 2020.  Symptoms then not attributable to carotid artery disease.  He remains asymptomatic. He does have some newer onset of unsteady gait. I have discussed with him that this could be for several reasons but I do not feel that this is relatable to his  carotid disease. His carotid duplex today shows 1-59% stenosis bilaterally with normal vertebral and subclavian flow. He has follow up next week with PCP and his annual eye examination in 2 weeks. He remains compliant with aspirin and statin. Regarding his lower extremities I have recommended elevation and exercise therapy. Clinically he does not have any venous dermatitis. His symptoms are likely related to his eczema. I have discussed with that we could evaluate him for venous insufficiency but at this time his symptoms are not bothersome so he would like to hold off but he will call for earlier follow up should he become more symptomatic.  We reviewed signs and symptoms of stroke/TIA.  Advised to seek immediate medical attention should these occur.  Follow-up in one year with bilateral carotid artery duplex   Graceann Congress, PA-C [facility] [phone]  Clinic MD:  Dr. Darrick Penna

## 2020-02-04 DIAGNOSIS — E559 Vitamin D deficiency, unspecified: Secondary | ICD-10-CM | POA: Diagnosis not present

## 2020-02-04 DIAGNOSIS — E1165 Type 2 diabetes mellitus with hyperglycemia: Secondary | ICD-10-CM | POA: Diagnosis not present

## 2020-02-04 DIAGNOSIS — I10 Essential (primary) hypertension: Secondary | ICD-10-CM | POA: Diagnosis not present

## 2020-02-04 DIAGNOSIS — E7800 Pure hypercholesterolemia, unspecified: Secondary | ICD-10-CM | POA: Diagnosis not present

## 2020-02-04 DIAGNOSIS — K219 Gastro-esophageal reflux disease without esophagitis: Secondary | ICD-10-CM | POA: Diagnosis not present

## 2020-02-04 DIAGNOSIS — E782 Mixed hyperlipidemia: Secondary | ICD-10-CM | POA: Diagnosis not present

## 2020-02-04 DIAGNOSIS — E1159 Type 2 diabetes mellitus with other circulatory complications: Secondary | ICD-10-CM | POA: Diagnosis not present

## 2020-02-09 DIAGNOSIS — I10 Essential (primary) hypertension: Secondary | ICD-10-CM | POA: Diagnosis not present

## 2020-02-09 DIAGNOSIS — E782 Mixed hyperlipidemia: Secondary | ICD-10-CM | POA: Diagnosis not present

## 2020-02-09 DIAGNOSIS — E1159 Type 2 diabetes mellitus with other circulatory complications: Secondary | ICD-10-CM | POA: Diagnosis not present

## 2020-02-09 DIAGNOSIS — R609 Edema, unspecified: Secondary | ICD-10-CM | POA: Diagnosis not present

## 2020-02-09 DIAGNOSIS — N138 Other obstructive and reflux uropathy: Secondary | ICD-10-CM | POA: Diagnosis not present

## 2020-02-09 DIAGNOSIS — Z0001 Encounter for general adult medical examination with abnormal findings: Secondary | ICD-10-CM | POA: Diagnosis not present

## 2020-02-09 DIAGNOSIS — Z6826 Body mass index (BMI) 26.0-26.9, adult: Secondary | ICD-10-CM | POA: Diagnosis not present

## 2020-02-09 DIAGNOSIS — N401 Enlarged prostate with lower urinary tract symptoms: Secondary | ICD-10-CM | POA: Diagnosis not present

## 2020-06-16 DIAGNOSIS — E1159 Type 2 diabetes mellitus with other circulatory complications: Secondary | ICD-10-CM | POA: Diagnosis not present

## 2020-06-16 DIAGNOSIS — D519 Vitamin B12 deficiency anemia, unspecified: Secondary | ICD-10-CM | POA: Diagnosis not present

## 2020-06-16 DIAGNOSIS — E7800 Pure hypercholesterolemia, unspecified: Secondary | ICD-10-CM | POA: Diagnosis not present

## 2020-06-16 DIAGNOSIS — E119 Type 2 diabetes mellitus without complications: Secondary | ICD-10-CM | POA: Diagnosis not present

## 2020-06-16 DIAGNOSIS — I10 Essential (primary) hypertension: Secondary | ICD-10-CM | POA: Diagnosis not present

## 2020-06-16 DIAGNOSIS — K219 Gastro-esophageal reflux disease without esophagitis: Secondary | ICD-10-CM | POA: Diagnosis not present

## 2020-06-16 DIAGNOSIS — E1165 Type 2 diabetes mellitus with hyperglycemia: Secondary | ICD-10-CM | POA: Diagnosis not present

## 2020-06-16 DIAGNOSIS — E559 Vitamin D deficiency, unspecified: Secondary | ICD-10-CM | POA: Diagnosis not present

## 2020-06-16 DIAGNOSIS — R739 Hyperglycemia, unspecified: Secondary | ICD-10-CM | POA: Diagnosis not present

## 2020-06-16 DIAGNOSIS — E782 Mixed hyperlipidemia: Secondary | ICD-10-CM | POA: Diagnosis not present

## 2020-06-24 DIAGNOSIS — R059 Cough, unspecified: Secondary | ICD-10-CM | POA: Diagnosis not present

## 2020-06-24 DIAGNOSIS — J029 Acute pharyngitis, unspecified: Secondary | ICD-10-CM | POA: Diagnosis not present

## 2020-06-24 DIAGNOSIS — R0981 Nasal congestion: Secondary | ICD-10-CM | POA: Diagnosis not present

## 2020-06-24 DIAGNOSIS — R519 Headache, unspecified: Secondary | ICD-10-CM | POA: Diagnosis not present

## 2020-07-05 DIAGNOSIS — Z0000 Encounter for general adult medical examination without abnormal findings: Secondary | ICD-10-CM | POA: Diagnosis not present

## 2020-07-05 DIAGNOSIS — I6930 Unspecified sequelae of cerebral infarction: Secondary | ICD-10-CM | POA: Diagnosis not present

## 2020-07-05 DIAGNOSIS — Z23 Encounter for immunization: Secondary | ICD-10-CM | POA: Diagnosis not present

## 2020-07-05 DIAGNOSIS — I679 Cerebrovascular disease, unspecified: Secondary | ICD-10-CM | POA: Diagnosis not present

## 2020-07-05 DIAGNOSIS — N401 Enlarged prostate with lower urinary tract symptoms: Secondary | ICD-10-CM | POA: Diagnosis not present

## 2020-07-05 DIAGNOSIS — E1159 Type 2 diabetes mellitus with other circulatory complications: Secondary | ICD-10-CM | POA: Diagnosis not present

## 2020-07-05 DIAGNOSIS — I10 Essential (primary) hypertension: Secondary | ICD-10-CM | POA: Diagnosis not present

## 2020-07-05 DIAGNOSIS — E782 Mixed hyperlipidemia: Secondary | ICD-10-CM | POA: Diagnosis not present

## 2020-07-15 ENCOUNTER — Telehealth: Payer: Self-pay | Admitting: *Deleted

## 2020-07-15 NOTE — Telephone Encounter (Signed)
Prior Authorization done Via cover my meds for patients dupixent.    Waiting for Determination.

## 2020-07-18 ENCOUNTER — Telehealth: Payer: Self-pay | Admitting: *Deleted

## 2020-07-18 NOTE — Telephone Encounter (Signed)
Prior authorization approved for Dupixent.

## 2020-09-23 ENCOUNTER — Other Ambulatory Visit: Payer: Self-pay | Admitting: Dermatology

## 2020-10-26 DIAGNOSIS — E1159 Type 2 diabetes mellitus with other circulatory complications: Secondary | ICD-10-CM | POA: Diagnosis not present

## 2020-10-26 DIAGNOSIS — I10 Essential (primary) hypertension: Secondary | ICD-10-CM | POA: Diagnosis not present

## 2020-10-26 DIAGNOSIS — E782 Mixed hyperlipidemia: Secondary | ICD-10-CM | POA: Diagnosis not present

## 2020-10-26 DIAGNOSIS — E7849 Other hyperlipidemia: Secondary | ICD-10-CM | POA: Diagnosis not present

## 2020-10-26 DIAGNOSIS — K219 Gastro-esophageal reflux disease without esophagitis: Secondary | ICD-10-CM | POA: Diagnosis not present

## 2020-10-26 DIAGNOSIS — E1165 Type 2 diabetes mellitus with hyperglycemia: Secondary | ICD-10-CM | POA: Diagnosis not present

## 2020-10-31 DIAGNOSIS — E7849 Other hyperlipidemia: Secondary | ICD-10-CM | POA: Diagnosis not present

## 2020-10-31 DIAGNOSIS — I6930 Unspecified sequelae of cerebral infarction: Secondary | ICD-10-CM | POA: Diagnosis not present

## 2020-10-31 DIAGNOSIS — E1159 Type 2 diabetes mellitus with other circulatory complications: Secondary | ICD-10-CM | POA: Diagnosis not present

## 2020-10-31 DIAGNOSIS — I10 Essential (primary) hypertension: Secondary | ICD-10-CM | POA: Diagnosis not present

## 2020-10-31 DIAGNOSIS — R609 Edema, unspecified: Secondary | ICD-10-CM | POA: Diagnosis not present

## 2020-10-31 DIAGNOSIS — I679 Cerebrovascular disease, unspecified: Secondary | ICD-10-CM | POA: Diagnosis not present

## 2020-10-31 DIAGNOSIS — N401 Enlarged prostate with lower urinary tract symptoms: Secondary | ICD-10-CM | POA: Diagnosis not present

## 2020-10-31 DIAGNOSIS — N138 Other obstructive and reflux uropathy: Secondary | ICD-10-CM | POA: Diagnosis not present

## 2020-11-22 ENCOUNTER — Telehealth: Payer: Self-pay | Admitting: *Deleted

## 2020-11-22 NOTE — Telephone Encounter (Signed)
Received fax for refill for dupixent- denied patient needs office visit

## 2020-11-23 ENCOUNTER — Other Ambulatory Visit: Payer: Self-pay | Admitting: Dermatology

## 2020-11-28 DIAGNOSIS — Z125 Encounter for screening for malignant neoplasm of prostate: Secondary | ICD-10-CM | POA: Diagnosis not present

## 2020-11-28 DIAGNOSIS — N5201 Erectile dysfunction due to arterial insufficiency: Secondary | ICD-10-CM | POA: Diagnosis not present

## 2020-11-28 DIAGNOSIS — R3911 Hesitancy of micturition: Secondary | ICD-10-CM | POA: Diagnosis not present

## 2020-11-29 ENCOUNTER — Ambulatory Visit: Payer: Medicare PPO | Admitting: Dermatology

## 2020-11-30 ENCOUNTER — Ambulatory Visit: Payer: Medicare PPO | Admitting: Dermatology

## 2020-11-30 ENCOUNTER — Other Ambulatory Visit: Payer: Self-pay

## 2020-11-30 DIAGNOSIS — L209 Atopic dermatitis, unspecified: Secondary | ICD-10-CM

## 2020-11-30 MED ORDER — DUPIXENT 300 MG/2ML ~~LOC~~ SOSY: PREFILLED_SYRINGE | SUBCUTANEOUS | 0 refills | Status: DC

## 2020-11-30 MED ORDER — BETAMETHASONE DIPROPIONATE 0.05 % EX CREA: TOPICAL_CREAM | Freq: Every day | CUTANEOUS | 3 refills | Status: DC

## 2020-11-30 NOTE — Patient Instructions (Signed)
Triple paste ointment

## 2020-12-07 ENCOUNTER — Other Ambulatory Visit: Payer: Self-pay | Admitting: Family Medicine

## 2020-12-11 ENCOUNTER — Encounter: Payer: Self-pay | Admitting: Dermatology

## 2020-12-11 NOTE — Progress Notes (Signed)
   Follow-Up Visit   Subjective  Marcus Carroll is a 75 y.o. male who presents for the following: Eczema (Here follow up patient is on dupixent. He is doing well. Pretty much clear but still has some itching on ankles, knee and calf area. Overall patient is much better. ).  Eczema Location: Was all over Duration:  Quality: Essentially clear Associated Signs/Symptoms: Modifying Factors:  Severity:  Timing: Context:   Objective  Well appearing patient in no apparent distress; mood and affect are within normal limits. Objective  Chest - Medial [facility]), Mid Back: No active patches of eczema.  Itching on ankles may be neurogenic.    All skin waist up examined.  Plus ankles.   Assessment & Plan    Atopic dermatitis, unspecified type (2) Chest - Medial [facility]); Mid Back  Patient on dupixent with good results.  Gust updated data on frequency of ocular surface changes with Dupixent; patient denies any eye symptoms but encouraged to get annual ophthalmologic examination.  May use over-the-counter itch lotion with the ingredient pramoxine for his ankles.  We will continue Dupixent every 2 weeks.  betamethasone dipropionate 0.05 % cream - Chest - Medial (Center), Mid Back  dupilumab (DUPIXENT) 300 MG/2ML prefilled syringe - Chest - Medial (Center), Mid Back      I, Janalyn Stebner, MD, have reviewed all documentation for this visit.  The documentation on 12/11/20 for the exam, diagnosis, procedures, and orders are all accurate and complete.

## 2020-12-18 ENCOUNTER — Encounter: Payer: Self-pay | Admitting: Cardiology

## 2020-12-18 NOTE — Progress Notes (Signed)
Cardiology Office Note  Date: 12/19/2020   ID: Marcus Carroll, DOB 03/21/1946, MRN [id number]  PCP:  Estanislado Pandy, MD  Cardiologist:  Nona Dell, MD Electrophysiologist:  None   Chief Complaint  Patient presents with  . Cardiac follow-up    History of Present Illness: Marcus Carroll is a 75 y.o. male former patient of Dr. Purvis Sheffield now presenting to establish follow-up with me.  I reviewed his records and updated the chart.  He was last seen in May 2021 by Mr. Vincenza Hews NP.  He is here today for a routine visit.  Reports no exertional chest pain or palpitations with typical activities.  Also states that his leg swelling has been well controlled on chlorthalidone.  I reviewed his medications.  He had follow-up carotid Dopplers through [facility] in June of last year, at that time had only mild bilateral ICA atherosclerosis.  He is on aspirin and statin therapy, tolerating both.  I personally reviewed his ECG today which shows sinus rhythm with PVC.  Past Medical History:  Diagnosis Date  . Atrial premature depolarization   . Carotid artery disease (HCC)   . Eczema   . Essential hypertension   . History of stroke    Right MCA distribution  . Hyperlipidemia   . Insomnia   . Type 2 diabetes mellitus (HCC)   . Venous insufficiency     Past Surgical History:  Procedure Laterality Date  . GALLBLADDER SURGERY  1985    Current Outpatient Medications  Medication Sig Dispense Refill  . aspirin EC 81 MG tablet Take 81 mg by mouth daily.    Marland Kitchen atorvastatin (LIPITOR) 40 MG tablet Take 40 mg by mouth daily.    . betamethasone dipropionate 0.05 % cream Apply topically daily. (Patient taking differently: Apply topically as needed.) 45 g 3  . chlorthalidone (HYGROTON) 25 MG tablet TAKE 1 TABLET BY MOUTH EVERY DAY 90 tablet 3  . [facility]S OMEPRAZOLE 20 MG TBEC Take 1 tablet by mouth daily.    . dupilumab (DUPIXENT) 300 MG/2ML prefilled syringe INJECT 300MG  (1 SYRINGE) SUBCUTANEOUSLY EVERY 2  WEEKS AS DIRECTED. 2 mL 0  . finasteride (PROSCAR) 5 MG tablet Take 5 mg by mouth daily.    KLOR-CON M20 20 MEQ tablet TAKE 1 TABLET BY MOUTH EVERY DAY 90 tablet 3  . lisinopril (ZESTRIL) 10 MG tablet TAKE 1 TABLET BY MOUTH EVERY DAY 90 tablet 2  . metFORMIN (GLUCOPHAGE-XR) 500 MG 24 hr tablet Take 500 mg by mouth daily with breakfast.    . terazosin (HYTRIN) 2 MG capsule Take 2 mg by mouth at bedtime.     No current facility-administered medications for this visit.   Allergies:  Patient has no known allergies.   ROS: No sudden dizziness or syncope.Marland Kitchen   Physical Exam: VS:  BP (!) 116/52   Pulse 60   Ht 6\' 1"  (1.854 m)   Wt 199 lb 9.6 oz (90.5 kg)   SpO2 97%   BMI 26.33 kg/m , BMI Body mass index is 26.33 kg/m.  Wt Readings from Last 3 Encounters:  12/19/20 199 lb 9.6 oz (90.5 kg)  02/03/20 192 lb (87.1 kg)  12/07/19 197 lb (89.4 kg)    General: Patient appears comfortable at rest. HEENT: Conjunctiva and lids normal, wearing a mask. Neck: Supple, no elevated JVP or carotid bruits, no thyromegaly. Lungs: Clear to auscultation, nonlabored breathing at rest. Cardiac: Regular rate and rhythm, no S3 or significant systolic murmur, no  pericardial rub. Extremities: No pitting edema.  ECG:  An ECG dated 12/07/2019 was personally reviewed today and demonstrated:  Sinus rhythm with PACs.  Recent Labwork:  No interval lab work for review today.  Other Studies Reviewed Today:  Echocardiogram 11/01/2015 [facility]): Mild LVH with LVEF 55 to 60%, mild left atrial enlargement, trace mitral regurgitation, estimated RVSP 28 mmHg, no pericardial effusion  Carotid Dopplers 02/03/2020: Summary:  Right Carotid: Velocities in the right ICA are consistent with a 1-39%  stenosis.         Non-hemodynamically significant plaque <50% noted in the  CCA.   Left Carotid: Velocities in the left ICA are consistent with a 1-39%  stenosis.   Vertebrals: Bilateral vertebral arteries  demonstrate antegrade flow.  Subclavians: Normal flow hemodynamics were seen in bilateral subclavian        arteries.   Assessment and Plan:  1.  Asymptomatic, mild carotid artery stenosis with follow-up carotid Dopplers in June outlined above.  Continue aspirin and statin.  2.  Essential hypertension by history, blood pressure is normal today.  He remains on lisinopril and chlorthalidone.  3.  History of leg swelling and venous stasis, symptomatically controlled on chlorthalidone with potassium supplement.  Medication Adjustments/Labs and Tests Ordered: Current medicines are reviewed at length with the patient today.  Concerns regarding medicines are outlined above.   Tests Ordered: Orders Placed This Encounter  Procedures  . EKG 12-Lead    Medication Changes: No orders of the defined types were placed in this encounter.   Disposition:  Follow up 1 year.  Signed, Jonelle Sidle, MD, [facility] 12/19/2020 11:56 AM    [facility] [address] [place], [place], [place] [address] Phone: [phone]; Fax: [phone]

## 2020-12-19 ENCOUNTER — Ambulatory Visit: Payer: Medicare PPO | Admitting: Cardiology

## 2020-12-19 ENCOUNTER — Encounter: Payer: Self-pay | Admitting: *Deleted

## 2020-12-19 ENCOUNTER — Encounter: Payer: Self-pay | Admitting: Cardiology

## 2020-12-19 VITALS — BP 116/52 | HR 60 | Ht 73.0 in | Wt 199.6 lb

## 2020-12-19 DIAGNOSIS — I10 Essential (primary) hypertension: Secondary | ICD-10-CM

## 2020-12-19 DIAGNOSIS — I6523 Occlusion and stenosis of bilateral carotid arteries: Secondary | ICD-10-CM | POA: Diagnosis not present

## 2020-12-19 NOTE — Patient Instructions (Addendum)

## 2020-12-28 ENCOUNTER — Telehealth: Payer: Self-pay | Admitting: Dermatology

## 2020-12-28 NOTE — Telephone Encounter (Signed)
[facility] on line 1, says they got no response on refill for Dupixent

## 2020-12-28 NOTE — Telephone Encounter (Signed)
Informed [facility] patient due office visit- Marcus Carroll will let patient know

## 2020-12-31 DIAGNOSIS — R059 Cough, unspecified: Secondary | ICD-10-CM | POA: Insufficient documentation

## 2020-12-31 DIAGNOSIS — J029 Acute pharyngitis, unspecified: Secondary | ICD-10-CM | POA: Diagnosis not present

## 2021-01-07 DIAGNOSIS — J320 Chronic maxillary sinusitis: Secondary | ICD-10-CM | POA: Diagnosis not present

## 2021-01-07 DIAGNOSIS — R059 Cough, unspecified: Secondary | ICD-10-CM | POA: Diagnosis not present

## 2021-01-25 DIAGNOSIS — Z20828 Contact with and (suspected) exposure to other viral communicable diseases: Secondary | ICD-10-CM | POA: Diagnosis not present

## 2021-01-25 DIAGNOSIS — J069 Acute upper respiratory infection, unspecified: Secondary | ICD-10-CM | POA: Diagnosis not present

## 2021-03-13 DIAGNOSIS — I10 Essential (primary) hypertension: Secondary | ICD-10-CM | POA: Diagnosis not present

## 2021-03-13 DIAGNOSIS — E1159 Type 2 diabetes mellitus with other circulatory complications: Secondary | ICD-10-CM | POA: Diagnosis not present

## 2021-03-13 DIAGNOSIS — E782 Mixed hyperlipidemia: Secondary | ICD-10-CM | POA: Diagnosis not present

## 2021-03-13 DIAGNOSIS — E7849 Other hyperlipidemia: Secondary | ICD-10-CM | POA: Diagnosis not present

## 2021-03-13 DIAGNOSIS — K219 Gastro-esophageal reflux disease without esophagitis: Secondary | ICD-10-CM | POA: Diagnosis not present

## 2021-03-16 DIAGNOSIS — I679 Cerebrovascular disease, unspecified: Secondary | ICD-10-CM | POA: Diagnosis not present

## 2021-03-16 DIAGNOSIS — E7849 Other hyperlipidemia: Secondary | ICD-10-CM | POA: Diagnosis not present

## 2021-03-16 DIAGNOSIS — E1140 Type 2 diabetes mellitus with diabetic neuropathy, unspecified: Secondary | ICD-10-CM | POA: Diagnosis not present

## 2021-03-16 DIAGNOSIS — R609 Edema, unspecified: Secondary | ICD-10-CM | POA: Diagnosis not present

## 2021-03-16 DIAGNOSIS — E1159 Type 2 diabetes mellitus with other circulatory complications: Secondary | ICD-10-CM | POA: Diagnosis not present

## 2021-03-16 DIAGNOSIS — I10 Essential (primary) hypertension: Secondary | ICD-10-CM | POA: Diagnosis not present

## 2021-03-16 DIAGNOSIS — I6930 Unspecified sequelae of cerebral infarction: Secondary | ICD-10-CM | POA: Diagnosis not present

## 2021-03-16 DIAGNOSIS — N401 Enlarged prostate with lower urinary tract symptoms: Secondary | ICD-10-CM | POA: Diagnosis not present

## 2021-06-07 DIAGNOSIS — Z20828 Contact with and (suspected) exposure to other viral communicable diseases: Secondary | ICD-10-CM | POA: Diagnosis not present

## 2021-06-07 DIAGNOSIS — R059 Cough, unspecified: Secondary | ICD-10-CM | POA: Diagnosis not present

## 2021-07-05 DIAGNOSIS — E1159 Type 2 diabetes mellitus with other circulatory complications: Secondary | ICD-10-CM | POA: Diagnosis not present

## 2021-07-05 DIAGNOSIS — E782 Mixed hyperlipidemia: Secondary | ICD-10-CM | POA: Diagnosis not present

## 2021-07-05 DIAGNOSIS — E1140 Type 2 diabetes mellitus with diabetic neuropathy, unspecified: Secondary | ICD-10-CM | POA: Diagnosis not present

## 2021-07-05 DIAGNOSIS — I10 Essential (primary) hypertension: Secondary | ICD-10-CM | POA: Diagnosis not present

## 2021-07-05 DIAGNOSIS — E1165 Type 2 diabetes mellitus with hyperglycemia: Secondary | ICD-10-CM | POA: Diagnosis not present

## 2021-07-05 DIAGNOSIS — K219 Gastro-esophageal reflux disease without esophagitis: Secondary | ICD-10-CM | POA: Diagnosis not present

## 2021-07-05 DIAGNOSIS — E7849 Other hyperlipidemia: Secondary | ICD-10-CM | POA: Diagnosis not present

## 2021-07-12 DIAGNOSIS — E1159 Type 2 diabetes mellitus with other circulatory complications: Secondary | ICD-10-CM | POA: Diagnosis not present

## 2021-07-12 DIAGNOSIS — Z0001 Encounter for general adult medical examination with abnormal findings: Secondary | ICD-10-CM | POA: Diagnosis not present

## 2021-07-12 DIAGNOSIS — N138 Other obstructive and reflux uropathy: Secondary | ICD-10-CM | POA: Diagnosis not present

## 2021-07-12 DIAGNOSIS — I10 Essential (primary) hypertension: Secondary | ICD-10-CM | POA: Diagnosis not present

## 2021-07-12 DIAGNOSIS — E1140 Type 2 diabetes mellitus with diabetic neuropathy, unspecified: Secondary | ICD-10-CM | POA: Diagnosis not present

## 2021-07-12 DIAGNOSIS — N401 Enlarged prostate with lower urinary tract symptoms: Secondary | ICD-10-CM | POA: Diagnosis not present

## 2021-07-12 DIAGNOSIS — E7849 Other hyperlipidemia: Secondary | ICD-10-CM | POA: Diagnosis not present

## 2021-07-12 DIAGNOSIS — I679 Cerebrovascular disease, unspecified: Secondary | ICD-10-CM | POA: Diagnosis not present

## 2021-11-01 DIAGNOSIS — I10 Essential (primary) hypertension: Secondary | ICD-10-CM | POA: Diagnosis not present

## 2021-11-01 DIAGNOSIS — E7849 Other hyperlipidemia: Secondary | ICD-10-CM | POA: Diagnosis not present

## 2021-11-01 DIAGNOSIS — K219 Gastro-esophageal reflux disease without esophagitis: Secondary | ICD-10-CM | POA: Diagnosis not present

## 2021-11-01 DIAGNOSIS — E1165 Type 2 diabetes mellitus with hyperglycemia: Secondary | ICD-10-CM | POA: Diagnosis not present

## 2021-11-01 DIAGNOSIS — E782 Mixed hyperlipidemia: Secondary | ICD-10-CM | POA: Diagnosis not present

## 2021-11-01 DIAGNOSIS — E1140 Type 2 diabetes mellitus with diabetic neuropathy, unspecified: Secondary | ICD-10-CM | POA: Diagnosis not present

## 2021-11-01 DIAGNOSIS — E1159 Type 2 diabetes mellitus with other circulatory complications: Secondary | ICD-10-CM | POA: Diagnosis not present

## 2021-11-01 DIAGNOSIS — G459 Transient cerebral ischemic attack, unspecified: Secondary | ICD-10-CM | POA: Diagnosis not present

## 2021-11-08 DIAGNOSIS — I6930 Unspecified sequelae of cerebral infarction: Secondary | ICD-10-CM | POA: Diagnosis not present

## 2021-11-08 DIAGNOSIS — N401 Enlarged prostate with lower urinary tract symptoms: Secondary | ICD-10-CM | POA: Diagnosis not present

## 2021-11-08 DIAGNOSIS — I679 Cerebrovascular disease, unspecified: Secondary | ICD-10-CM | POA: Diagnosis not present

## 2021-11-08 DIAGNOSIS — E1159 Type 2 diabetes mellitus with other circulatory complications: Secondary | ICD-10-CM | POA: Diagnosis not present

## 2021-11-08 DIAGNOSIS — N138 Other obstructive and reflux uropathy: Secondary | ICD-10-CM | POA: Diagnosis not present

## 2021-11-08 DIAGNOSIS — R609 Edema, unspecified: Secondary | ICD-10-CM | POA: Diagnosis not present

## 2021-11-08 DIAGNOSIS — I10 Essential (primary) hypertension: Secondary | ICD-10-CM | POA: Diagnosis not present

## 2021-11-08 DIAGNOSIS — E1140 Type 2 diabetes mellitus with diabetic neuropathy, unspecified: Secondary | ICD-10-CM | POA: Diagnosis not present

## 2021-11-16 DIAGNOSIS — B356 Tinea cruris: Secondary | ICD-10-CM | POA: Diagnosis not present

## 2021-12-04 ENCOUNTER — Ambulatory Visit: Payer: Medicare PPO | Admitting: Dermatology

## 2021-12-04 DIAGNOSIS — L719 Rosacea, unspecified: Secondary | ICD-10-CM

## 2021-12-04 DIAGNOSIS — L209 Atopic dermatitis, unspecified: Secondary | ICD-10-CM

## 2021-12-04 DIAGNOSIS — L821 Other seborrheic keratosis: Secondary | ICD-10-CM | POA: Diagnosis not present

## 2021-12-04 DIAGNOSIS — Z1283 Encounter for screening for malignant neoplasm of skin: Secondary | ICD-10-CM

## 2021-12-04 DIAGNOSIS — L2084 Intrinsic (allergic) eczema: Secondary | ICD-10-CM

## 2021-12-04 MED ORDER — BETAMETHASONE DIPROPIONATE 0.05 % EX CREA: TOPICAL_CREAM | Freq: Every day | CUTANEOUS | 10 refills | Status: DC

## 2021-12-04 NOTE — Progress Notes (Signed)
? ?  Follow-Up Visit ?  ?Subjective  ?Marcus Carroll is a 76 y.o. male who presents for the following: Annual Exam (Here for annual skin exam. Concerns patients skin is very itchy all over. Patient was on Dupixent but then stopped it. And was good for awhile but now the itching is back. ). ? ?Generalized eczema plus skin check ?Location:  ?Duration:  ?Quality:  ?Associated Signs/Symptoms: ?Modifying Factors:  ?Severity:  ?Timing: ?Context:  ? ?Objective  ?Well appearing patient in no apparent distress; mood and affect are within normal limits. ? ? ?All skin waist up examined. ? ? ?Assessment & Plan  ? ? ?Intrinsic atopic dermatitis ? ? ?We discussed in some detail the association of 2 dogs in the house being associated with a flareup of his skin eczema.  We discussed testing by an allergist but this was not yet scheduled because it is difficult to interpret and his common sense may be more accurate.  He will think over whether the itching has enough negative impact on his life (worse between 1 AM and 2:30 AM) to justify restarting the Dupixent.  He can do this with a simple phone call or notify me via MyChart.  He has a large seborrheic keratoses on the right scalp and several on the back.  There is mild rosacea like rash on the face.  We will provide samples of a pramoxine containing lotion which he may use if he decides he is not interested in restarting the Dupixent.  We also discussed alternatives like Jak inhibitors. ? ? ?I, Janalyn Malena, MD, have reviewed all documentation for this visit.  The documentation on 12/04/21 for the exam, diagnosis, procedures, and orders are all accurate and complete. ?

## 2021-12-06 ENCOUNTER — Encounter: Payer: Self-pay | Admitting: Dermatology

## 2021-12-07 ENCOUNTER — Other Ambulatory Visit: Payer: Self-pay

## 2021-12-07 MED ORDER — POTASSIUM CHLORIDE CRYS ER 20 MEQ PO TBCR: 20.0000 meq | EXTENDED_RELEASE_TABLET | Freq: Every day | ORAL | 1 refills | Status: DC

## 2021-12-07 MED ORDER — CHLORTHALIDONE 25 MG PO TABS: 25.0000 mg | ORAL_TABLET | Freq: Every day | ORAL | 1 refills | Status: DC

## 2021-12-11 ENCOUNTER — Telehealth: Payer: Self-pay | Admitting: *Deleted

## 2021-12-11 NOTE — Telephone Encounter (Signed)
Marcus Carroll Has received his Dupixent prescription they are verifying his insurance.  ?

## 2021-12-12 ENCOUNTER — Other Ambulatory Visit: Payer: Self-pay

## 2021-12-12 DIAGNOSIS — I6523 Occlusion and stenosis of bilateral carotid arteries: Secondary | ICD-10-CM

## 2021-12-14 ENCOUNTER — Telehealth: Payer: Self-pay | Admitting: *Deleted

## 2021-12-14 NOTE — Telephone Encounter (Signed)
Fax from [facility] regarding dupixent. Initial benefit investigation for this patient has been completed. The insurance company has instructed [employer] that a prior authorization must be reviewed before they approve this medication. Marcus Carroll is reviewing the patients file nad will inform use once the prior authorization is submitted to e the company, they will also reach out to [place] if any additional information is needed.  ?

## 2021-12-19 ENCOUNTER — Telehealth: Payer: Self-pay | Admitting: *Deleted

## 2021-12-19 NOTE — Telephone Encounter (Signed)
Dupixent approved- 08-06-21-08/05/22. Prior authorization #- N/A ?

## 2021-12-25 ENCOUNTER — Telehealth: Payer: Self-pay

## 2021-12-25 NOTE — Telephone Encounter (Signed)
Fax received from [facility] stating that the patient has agreed to delivery and his copay is $100.

## 2021-12-27 ENCOUNTER — Telehealth: Payer: Self-pay

## 2021-12-27 NOTE — Progress Notes (Signed)
History of Present Illness:  Patient is a 76 y.o. year old male who presents for evaluation of carotid stenosis.  He has a history of right MCA stroke and was seen at [facility].  He has not had new symptoms TIA, amaurosis, or stroke.  He denies  residual deficits.    He denies symptoms of claudication, rest pain or non healing wounds.  He is medically managed on ASA and Statin daily.    Past Medical History:  Diagnosis Date   Atrial premature depolarization    Carotid artery disease (HCC)    Eczema    Essential hypertension    History of stroke    Right MCA distribution   Hyperlipidemia    Insomnia    Type 2 diabetes mellitus (HCC)    Venous insufficiency     Past Surgical History:  Procedure Laterality Date   GALLBLADDER SURGERY  1985     Social History Social History   Tobacco Use   Smoking status: Former    Types: Cigarettes   Smokeless tobacco: Never  Vaping Use   Vaping Use: Never used  Substance Use Topics   Alcohol use: Yes   Drug use: Never    Family History Family History  Problem Relation Age of Onset   Heart murmur Father    Heart attack Maternal Grandfather     Allergies  No Known Allergies   Current Outpatient Medications  Medication Sig Dispense Refill   aspirin 81 MG EC tablet Take 1 tablet by mouth daily.     aspirin EC 81 MG tablet Take 81 mg by mouth daily.     atorvastatin (LIPITOR) 40 MG tablet Take 40 mg by mouth daily.     betamethasone dipropionate 0.05 % cream Apply topically daily. 45 g 10   chlorthalidone (HYGROTON) 25 MG tablet Take 1 tablet (25 mg total) by mouth daily. 90 tablet 1   [employer] OMEPRAZOLE 20 MG TBEC Take 1 tablet by mouth daily.     dupilumab (DUPIXENT) 300 MG/2ML prefilled syringe INJECT  (1 SYRINGE) SUBCUTANEOUSLY EVERY 2 WEEKS AS DIRECTED. 2 mL 0   finasteride (PROSCAR) 5 MG tablet Take 5 mg by mouth daily.     finasteride (PROSCAR) 5 MG tablet Take by mouth.     lisinopril (ZESTRIL) 10 MG tablet  TAKE 1 TABLET BY MOUTH EVERY DAY 90 tablet 2   metFORMIN (GLUCOPHAGE-XR) 500 MG 24 hr tablet Take 500 mg by mouth daily with breakfast.     omeprazole (PRILOSEC) 20 MG capsule Take 1 capsule by mouth daily.     potassium chloride SA (KLOR-CON M20) 20 MEQ tablet Take 1 tablet (20 mEq total) by mouth daily. 90 tablet 1   terazosin (HYTRIN) 2 MG capsule Take 2 mg by mouth at bedtime.     No current facility-administered medications for this visit.    ROS:   General:  No weight loss, Fever, chills  HEENT: No recent headaches, no nasal bleeding, no visual changes, no sore throat  Neurologic: No dizziness, blackouts, seizures. No recent symptoms of stroke or mini- stroke. No recent episodes of slurred speech, or temporary blindness.  Cardiac: No recent episodes of chest pain/pressure, no shortness of breath at rest.  No shortness of breath with exertion.  Denies history of atrial fibrillation or irregular heartbeat  Vascular: No history of rest pain in feet.  No history of claudication.  No history of non-healing ulcer, No history of DVT   Pulmonary: No home  oxygen, no productive cough, no hemoptysis,  No asthma or wheezing  Musculoskeletal:  [ ]  Arthritis, [ ]  Low back pain,  [ x] Joint pain  Hematologic:No history of hypercoagulable state.  No history of easy bleeding.  No history of anemia  Gastrointestinal: No hematochezia or melena,  No gastroesophageal reflux, no trouble swallowing  Urinary: [ ]  chronic Kidney disease, [ ]  on HD - [ ]  MWF or [ ]  TTHS, [ ]  Burning with urination, [ ]  Frequent urination, [ ]  Difficulty urinating;   Skin: No rashes  Psychological: No history of anxiety,  No history of depression   Physical Examination  Vitals:   12/29/21 0845 12/29/21 0848  BP: 130/69 126/71  Pulse: 62 62  Resp: 16   Temp: (!) 97.3 F (36.3 C)   TempSrc: Temporal   SpO2: 100%   Weight: 200 lb (90.7 kg)   Height: 6' (1.829 m)     Body mass index is 27.12  kg/m.  General:  Alert and oriented, no acute distress HEENT: Normal Neck: No bruit or JVD Pulmonary: Clear to auscultation bilaterally Cardiac: Regular Rate and Rhythm without murmur Gastrointestinal: Soft, non-tender, non-distended, no mass, no scars Skin: No rash Extremity Pulses:  2+ radial, femoral, dorsalis pedis, posterior tibial pulses bilaterally Musculoskeletal: No deformity or edema  Neurologic: Upper and lower extremity motor 5/5 and symmetric  DATA:     Right Carotid Findings:  +----------+--------+--------+--------+------------------+--------+            PSV cm/sEDV cm/sStenosisPlaque DescriptionComments  +----------+--------+--------+--------+------------------+--------+  CCA Prox  87      8                                           +----------+--------+--------+--------+------------------+--------+  CCA Mid   72      11                                          +----------+--------+--------+--------+------------------+--------+  CCA Distal119     19              heterogenous                +----------+--------+--------+--------+------------------+--------+  ICA Prox  87      18      1-39%                               +----------+--------+--------+--------+------------------+--------+  ICA Mid   100     25                                          +----------+--------+--------+--------+------------------+--------+  ICA Distal90      27                                          +----------+--------+--------+--------+------------------+--------+  ECA       123     3                                           +----------+--------+--------+--------+------------------+--------+   +----------+--------+-------+----------------+-------------------+  PSV cm/sEDV cmsDescribe        Arm Pressure (mmHG)  +----------+--------+-------+----------------+-------------------+  Subclavian100     3       Multiphasic, WNL                     +----------+--------+-------+----------------+-------------------+   +---------+--------+--+--------+-+---------+  VertebralPSV cm/s44EDV cm/s9Antegrade  +---------+--------+--+--------+-+---------+       Left Carotid Findings:  +----------+--------+--------+--------+-------------------------+--------+            PSV cm/sEDV cm/sStenosisPlaque Description       Comments  +----------+--------+--------+--------+-------------------------+--------+  CCA Prox  94      16                                                 +----------+--------+--------+--------+-------------------------+--------+  CCA Mid   86      15              heterogenous                       +----------+--------+--------+--------+-------------------------+--------+  CCA Distal91      16              heterogenous                       +----------+--------+--------+--------+-------------------------+--------+  ICA Prox  201     44      40-59%  calcific and heterogenous          +----------+--------+--------+--------+-------------------------+--------+  ICA Mid   102     24                                                 +----------+--------+--------+--------+-------------------------+--------+  ICA Distal95      29                                                 +----------+--------+--------+--------+-------------------------+--------+  ECA       90      2                                                  +----------+--------+--------+--------+-------------------------+--------+   +----------+--------+--------+----------------+-------------------+            PSV cm/sEDV cm/sDescribe        Arm Pressure (mmHG)  +----------+--------+--------+----------------+-------------------+  [id number]     3       Multiphasic, WNL                     +----------+--------+--------+----------------+-------------------+    +---------+--------+--+--------+--+---------+  VertebralPSV cm/s50EDV cm/s15Antegrade  +---------+--------+--+--------+--+---------+   Right Brachial 138 cm, Left Brachial 134 cm, triphasic waveforms.       Summary:  Right Carotid: Velocities in the right ICA are consistent with a 1-39%  stenosis.   Left Carotid: Velocities in the left ICA are consistent with a 40-59%  stenosis,  lower end of range.   Vertebrals:  Bilateral vertebral arteries demonstrate antegrade flow.  Subclavians: Normal flow hemodynamics were seen in bilateral subclavian               arteries.   ASSESSMENT/PLAN: Asymptomatic carotid stenosis without change.  He denies amaurosis, aphasia or weakness.  The right ICA remains< 39% and the left is < 59% stenosis.  He will continue to take ASA and Statin daily for medical management.  I have scheduled him to return in 1 year for repeat duplex.   If he develops signs of stroke he will call 911.    Mosetta Pigeon PA-C [facility] [phone]  MD in clinic [place]

## 2021-12-27 NOTE — Telephone Encounter (Signed)
Patient's clinical notes sent to [facility] through the portal.

## 2021-12-27 NOTE — Telephone Encounter (Signed)
Fax received from [facility] stating they need clinical notes for the patient.

## 2021-12-29 ENCOUNTER — Ambulatory Visit (HOSPITAL_COMMUNITY)
Admission: RE | Admit: 2021-12-29 | Discharge: 2021-12-29 | Disposition: A | Discharge status: Home / Self Care | Payer: Medicare PPO | Source: Ambulatory Visit | Attending: Vascular Surgery | Admitting: Vascular Surgery

## 2021-12-29 ENCOUNTER — Ambulatory Visit: Payer: Medicare PPO | Admitting: Physician Assistant

## 2021-12-29 ENCOUNTER — Encounter: Payer: Self-pay | Admitting: Physician Assistant

## 2021-12-29 VITALS — BP 126/71 | HR 62 | Temp 97.3°F | Resp 16 | Ht 72.0 in | Wt 200.0 lb

## 2021-12-29 DIAGNOSIS — I6523 Occlusion and stenosis of bilateral carotid arteries: Secondary | ICD-10-CM

## 2022-01-08 DIAGNOSIS — Z125 Encounter for screening for malignant neoplasm of prostate: Secondary | ICD-10-CM | POA: Diagnosis not present

## 2022-01-08 DIAGNOSIS — R3911 Hesitancy of micturition: Secondary | ICD-10-CM | POA: Diagnosis not present

## 2022-01-08 DIAGNOSIS — N5201 Erectile dysfunction due to arterial insufficiency: Secondary | ICD-10-CM | POA: Diagnosis not present

## 2022-01-17 ENCOUNTER — Ambulatory Visit: Payer: Medicare PPO | Admitting: Podiatry

## 2022-01-17 DIAGNOSIS — B070 Plantar wart: Secondary | ICD-10-CM | POA: Diagnosis not present

## 2022-01-17 DIAGNOSIS — M79675 Pain in left toe(s): Secondary | ICD-10-CM | POA: Diagnosis not present

## 2022-01-17 DIAGNOSIS — M79674 Pain in right toe(s): Secondary | ICD-10-CM

## 2022-01-17 DIAGNOSIS — L84 Corns and callosities: Secondary | ICD-10-CM

## 2022-01-17 DIAGNOSIS — B351 Tinea unguium: Secondary | ICD-10-CM | POA: Diagnosis not present

## 2022-01-17 DIAGNOSIS — E1142 Type 2 diabetes mellitus with diabetic polyneuropathy: Secondary | ICD-10-CM

## 2022-01-17 NOTE — Progress Notes (Signed)
  Subjective:  Patient ID: Marcus Carroll, male    DOB: 11-09-1945,   MRN: [id number]  Chief Complaint  Patient presents with   Nail Problem     L ingrown nail  great toe  - R corn on pinkie toe    76 y.o. male presents for concern of left great ingrown toenail. Relates he has tried trimming but unable to trim himself. Requesting to have all nails trimmed today. Also relates a corn on the right pinky toe that is bothersome and some areas on the bottom of his feet as well. He is diabetic. Relates occasional burning and tingling in his feet. Last A1c was 6.2  . Denies any other pedal complaints. Denies n/v/f/c.   PCP: Consuello Masse MD   Past Medical History:  Diagnosis Date   Atrial premature depolarization    Carotid artery disease ([place])    Eczema    Essential hypertension    History of stroke    Right MCA distribution   Hyperlipidemia    Insomnia    Type 2 diabetes mellitus ([employer])    Venous insufficiency     Objective:  Physical Exam: Vascular: DP/PT pulses 2/4 bilateral. CFT <3 seconds. Absent hair growth on digits. Edema noted to bilateral lower extremities. Xerosis noted bilaterally.  Skin. No lacerations or abrasions bilateral feet. Nails 1-5 bilateral  are thickened discolored and elongated with subungual debris. Hyperkeratotic cored lesion noted to right fifth digit as well as sub third met head. Additional lesions Multiple capillary budding is noted throughout with cauliflower-like appearance and loss of skin tension lines within the lesion itself. Musculoskeletal: MMT 5/5 bilateral lower extremities in DF, PF, Inversion and Eversion. Deceased ROM in DF of ankle joint.  Neurological: Sensation intact to light touch. Protective sensation diminished bilateral.    Assessment:   1. Pain due to onychomycosis of toenails of both feet   2. Corns and callosities   3. Plantar wart of right foot   4. Type 2 diabetes mellitus with diabetic polyneuropathy, unspecified whether long  term insulin use ([place])      Plan:  Patient was evaluated and treated and all questions answered. -Discussed and educated patient on diabetic foot care, especially with  regards to the vascular, neurological and musculoskeletal systems.  -Stressed the importance of good glycemic control and the detriment of not  controlling glucose levels in relation to the foot. -Discussed supportive shoes at all times and checking feet regularly.  -Mechanically debrided all nails 1-5 bilateral using sterile nail nipper and filed with dremel without incident  -Hyperkeratotic tissue debrided as well. One area appears to be more of a wart this area was debrided and with capillary bleeding. Neosporin and bandaid applied for now. Will monitor this area.  -Answered all patient questions -Patient to return  in 3 months for at risk foot care -Patient advised to call the office if any problems or questions arise in the meantime.   Lorenda Peck, DPM

## 2022-01-22 DIAGNOSIS — H43811 Vitreous degeneration, right eye: Secondary | ICD-10-CM | POA: Diagnosis not present

## 2022-01-30 DIAGNOSIS — I10 Essential (primary) hypertension: Secondary | ICD-10-CM | POA: Diagnosis not present

## 2022-01-30 DIAGNOSIS — E119 Type 2 diabetes mellitus without complications: Secondary | ICD-10-CM | POA: Diagnosis not present

## 2022-01-30 DIAGNOSIS — Z7984 Long term (current) use of oral hypoglycemic drugs: Secondary | ICD-10-CM | POA: Diagnosis not present

## 2022-01-30 DIAGNOSIS — Z79899 Other long term (current) drug therapy: Secondary | ICD-10-CM | POA: Diagnosis not present

## 2022-01-30 DIAGNOSIS — L03115 Cellulitis of right lower limb: Secondary | ICD-10-CM | POA: Diagnosis not present

## 2022-01-30 DIAGNOSIS — Z7982 Long term (current) use of aspirin: Secondary | ICD-10-CM | POA: Diagnosis not present

## 2022-01-30 DIAGNOSIS — E785 Hyperlipidemia, unspecified: Secondary | ICD-10-CM | POA: Diagnosis not present

## 2022-01-31 DIAGNOSIS — L03119 Cellulitis of unspecified part of limb: Secondary | ICD-10-CM | POA: Diagnosis not present

## 2022-01-31 DIAGNOSIS — Z6827 Body mass index (BMI) 27.0-27.9, adult: Secondary | ICD-10-CM | POA: Diagnosis not present

## 2022-02-07 DIAGNOSIS — Z6827 Body mass index (BMI) 27.0-27.9, adult: Secondary | ICD-10-CM | POA: Diagnosis not present

## 2022-02-07 DIAGNOSIS — T7840XA Allergy, unspecified, initial encounter: Secondary | ICD-10-CM | POA: Diagnosis not present

## 2022-03-06 ENCOUNTER — Telehealth: Payer: Self-pay | Admitting: *Deleted

## 2022-03-06 DIAGNOSIS — L209 Atopic dermatitis, unspecified: Secondary | ICD-10-CM

## 2022-03-06 MED ORDER — DUPIXENT 300 MG/2ML ~~LOC~~ SOSY: PREFILLED_SYRINGE | SUBCUTANEOUS | 6 refills | Status: AC

## 2022-03-06 NOTE — Telephone Encounter (Signed)
[facility] fax stating patient due refill- sent refill to [facility].

## 2022-03-19 ENCOUNTER — Encounter: Payer: Self-pay | Admitting: Cardiology

## 2022-03-19 DIAGNOSIS — E7849 Other hyperlipidemia: Secondary | ICD-10-CM | POA: Diagnosis not present

## 2022-03-19 DIAGNOSIS — D519 Vitamin B12 deficiency anemia, unspecified: Secondary | ICD-10-CM | POA: Diagnosis not present

## 2022-03-19 DIAGNOSIS — E782 Mixed hyperlipidemia: Secondary | ICD-10-CM | POA: Diagnosis not present

## 2022-03-19 DIAGNOSIS — K219 Gastro-esophageal reflux disease without esophagitis: Secondary | ICD-10-CM | POA: Diagnosis not present

## 2022-03-19 DIAGNOSIS — E559 Vitamin D deficiency, unspecified: Secondary | ICD-10-CM | POA: Diagnosis not present

## 2022-03-19 DIAGNOSIS — E1165 Type 2 diabetes mellitus with hyperglycemia: Secondary | ICD-10-CM | POA: Diagnosis not present

## 2022-03-19 DIAGNOSIS — I10 Essential (primary) hypertension: Secondary | ICD-10-CM | POA: Diagnosis not present

## 2022-03-26 DIAGNOSIS — K219 Gastro-esophageal reflux disease without esophagitis: Secondary | ICD-10-CM | POA: Diagnosis not present

## 2022-03-26 DIAGNOSIS — N401 Enlarged prostate with lower urinary tract symptoms: Secondary | ICD-10-CM | POA: Diagnosis not present

## 2022-03-26 DIAGNOSIS — I6930 Unspecified sequelae of cerebral infarction: Secondary | ICD-10-CM | POA: Diagnosis not present

## 2022-03-26 DIAGNOSIS — I679 Cerebrovascular disease, unspecified: Secondary | ICD-10-CM | POA: Diagnosis not present

## 2022-03-26 DIAGNOSIS — E7849 Other hyperlipidemia: Secondary | ICD-10-CM | POA: Diagnosis not present

## 2022-03-26 DIAGNOSIS — I10 Essential (primary) hypertension: Secondary | ICD-10-CM | POA: Diagnosis not present

## 2022-03-26 DIAGNOSIS — N138 Other obstructive and reflux uropathy: Secondary | ICD-10-CM | POA: Diagnosis not present

## 2022-03-26 DIAGNOSIS — E1159 Type 2 diabetes mellitus with other circulatory complications: Secondary | ICD-10-CM | POA: Diagnosis not present

## 2022-03-26 DIAGNOSIS — R609 Edema, unspecified: Secondary | ICD-10-CM | POA: Diagnosis not present

## 2022-04-18 DIAGNOSIS — H43811 Vitreous degeneration, right eye: Secondary | ICD-10-CM | POA: Diagnosis not present

## 2022-04-23 ENCOUNTER — Ambulatory Visit (INDEPENDENT_AMBULATORY_CARE_PROVIDER_SITE_OTHER): Payer: Medicare PPO | Admitting: Podiatry

## 2022-04-23 ENCOUNTER — Encounter: Payer: Self-pay | Admitting: Podiatry

## 2022-04-23 DIAGNOSIS — B070 Plantar wart: Secondary | ICD-10-CM | POA: Diagnosis not present

## 2022-04-23 DIAGNOSIS — B351 Tinea unguium: Secondary | ICD-10-CM | POA: Diagnosis not present

## 2022-04-23 DIAGNOSIS — E1142 Type 2 diabetes mellitus with diabetic polyneuropathy: Secondary | ICD-10-CM | POA: Diagnosis not present

## 2022-04-23 DIAGNOSIS — M79675 Pain in left toe(s): Secondary | ICD-10-CM | POA: Diagnosis not present

## 2022-04-23 DIAGNOSIS — M79674 Pain in right toe(s): Secondary | ICD-10-CM | POA: Diagnosis not present

## 2022-04-23 DIAGNOSIS — L84 Corns and callosities: Secondary | ICD-10-CM

## 2022-04-23 NOTE — Progress Notes (Signed)
  Subjective:  Patient ID: Marcus Carroll, male    DOB: Mar 09, 1946,   MRN: [id number]  Chief Complaint  Patient presents with   Diabetes    Diabetic foot exam. No concerns voiced today    76 y.o. male presents for concern of left great ingrown toenail. Relates he has tried trimming but unable to trim himself. Requesting to have all nails trimmed today. Also relates a corn on the right pinky toe that is bothersome and some areas on the bottom of his feet as well. He is diabetic. Relates occasional burning and tingling in his feet. Last A1c was 6.2  . Denies any other pedal complaints. Denies n/v/f/c.   PCP: Consuello Masse MD   Past Medical History:  Diagnosis Date   Atrial premature depolarization    Carotid artery disease ([place])    Eczema    Essential hypertension    History of stroke    Right MCA distribution   Hyperlipidemia    Insomnia    Type 2 diabetes mellitus ([place])    Venous insufficiency     Objective:  Physical Exam: Vascular: DP/PT pulses 2/4 bilateral. CFT <3 seconds. Absent hair growth on digits. Edema noted to bilateral lower extremities. Xerosis noted bilaterally.  Skin. No lacerations or abrasions bilateral feet. Nails 1-5 bilateral  are thickened discolored and elongated with subungual debris. Hyperkeratotic cored lesion noted to right fifth digit as well as sub third met head. Additional lesions Multiple capillary budding is noted throughout with cauliflower-like appearance and loss of skin tension lines within the lesion itself. Musculoskeletal: MMT 5/5 bilateral lower extremities in DF, PF, Inversion and Eversion. Deceased ROM in DF of ankle joint.  Neurological: Sensation intact to light touch. Protective sensation diminished bilateral.    Assessment:   1. Pain due to onychomycosis of toenails of both feet   2. Type 2 diabetes mellitus with diabetic polyneuropathy, unspecified whether long term insulin use ([place])   3. Corns and callosities      Plan:  Patient  was evaluated and treated and all questions answered. -Discussed and educated patient on diabetic foot care, especially with  regards to the vascular, neurological and musculoskeletal systems.  -Stressed the importance of good glycemic control and the detriment of not  controlling glucose levels in relation to the foot. -Discussed supportive shoes at all times and checking feet regularly.  -Mechanically debrided all nails 1-5 bilateral using sterile nail nipper and filed with dremel without incident  -Hyperkeratotic tissue debrided as well. One area appears to be more of a wart this area was debrided and with capillary bleeding. Area treated with salycylic acid and band aid. Discussed aftercare. Marland Kitchen  -Answered all patient questions -Patient to return  in 3 months for at risk foot care -Patient advised to call the office if any problems or questions arise in the meantime.   Lorenda Peck, DPM

## 2022-04-25 ENCOUNTER — Ambulatory Visit: Payer: Medicare PPO | Admitting: Podiatry

## 2022-05-20 NOTE — Progress Notes (Unsigned)
Cardiology Office Note  Date: 05/21/2022   ID: Marcus Carroll, DOB [birth year], MRN [id number]  PCP:  Manon Hilding, MD  Cardiologist:  Rozann Lesches, MD Electrophysiologist:  None   Chief Complaint  Patient presents with   Cardiac follow-up    History of Present Illness: Marcus Carroll is a 76 y.o. male last seen in May 2022.  He is here for a routine visit.  Reports no exertional chest pain or unusual breathlessness with typical activities.  Has had trouble with peripheral neuropathy in the setting of type 2 diabetes mellitus.  No focal motor weakness.  I personally reviewed his ECG today which shows sinus rhythm with PAC.  We went over his medications, get his on low-dose aspirin and Lipitor.  Requesting interval lab work from Dr. Quintin Alto.  He had carotid Dopplers in May of this year showing mild RICA stenosis and moderate LICA stenosis.  Past Medical History:  Diagnosis Date   Atrial premature depolarization    Carotid artery disease ([place])    Eczema    Essential hypertension    History of stroke    Right MCA distribution   Hyperlipidemia    Insomnia    Type 2 diabetes mellitus ([place])    Venous insufficiency     Past Surgical History:  Procedure Laterality Date   GALLBLADDER SURGERY  1985    Current Outpatient Medications  Medication Sig Dispense Refill   aspirin EC 81 MG tablet Take 81 mg by mouth daily.     atorvastatin (LIPITOR) 40 MG tablet Take 40 mg by mouth daily.     betamethasone dipropionate 0.05 % cream Apply 1 Application topically as needed.     chlorthalidone (HYGROTON) 25 MG tablet Take 1 tablet (25 mg total) by mouth daily. 90 tablet 1   Coenzyme Q10 (CO Q 10 PO) Take 1 tablet by mouth daily.     Cyanocobalamin (B-12 PO) Take 1 tablet by mouth daily.     dupilumab (DUPIXENT) 300 MG/2ML prefilled syringe INJECT 300MG  (1 SYRINGE) SUBCUTANEOUSLY EVERY 2 WEEKS AS DIRECTED. 4 mL 6   finasteride (PROSCAR) 5 MG tablet Take 5 mg by mouth daily.      lisinopril (ZESTRIL) 10 MG tablet TAKE 1 TABLET BY MOUTH EVERY DAY 90 tablet 2   metFORMIN (GLUCOPHAGE-XR) 500 MG 24 hr tablet Take 500 mg by mouth daily with breakfast.     omeprazole (PRILOSEC) 20 MG capsule Take 1 capsule by mouth daily.     potassium chloride SA (KLOR-CON M20) 20 MEQ tablet Take 1 tablet (20 mEq total) by mouth daily. 90 tablet 1   terazosin (HYTRIN) 2 MG capsule Take 2 mg by mouth at bedtime.     No current facility-administered medications for this visit.   Allergies:  Patient has no known allergies.   ROS:  No syncope.  Physical Exam: VS:  BP 122/68 (BP Location: Left Arm, Patient Position: Sitting, Cuff Size: Normal)   Pulse (!) 58   Ht 6' (1.829 m)   Wt 194 lb 9.6 oz (88.3 kg)   SpO2 95%   BMI 26.39 kg/m , BMI Body mass index is 26.39 kg/m.  Wt Readings from Last 3 Encounters:  05/21/22 194 lb 9.6 oz (88.3 kg)  12/29/21 200 lb (90.7 kg)  12/19/20 199 lb 9.6 oz (90.5 kg)    General: Patient appears comfortable at rest. HEENT: Conjunctiva and lids normal. Neck: Supple, no elevated JVP or carotid bruits. Lungs: Clear to auscultation, nonlabored breathing  at rest. Cardiac: Regular rate and rhythm, no S3 or significant systolic murmur. Extremities: No pitting edema.  ECG:  An ECG dated 12/19/2020 was personally reviewed today and demonstrated:  Sinus rhythm with PVC.  Recent Labwork:  June 2023: Hemoglobin 13.9, platelets 209, magnesium 1.6, potassium 3.4, BUN 21, creatinine 1.15  Other Studies Reviewed Today:  Carotid Dopplers 12/29/2021: Summary:  Right Carotid: Velocities in the right ICA are consistent with a 1-39%  stenosis.   Left Carotid: Velocities in the left ICA are consistent with a 40-59%  stenosis,                lower end of range.   Vertebrals:  Bilateral vertebral arteries demonstrate antegrade flow.  Subclavians: Normal flow hemodynamics were seen in bilateral subclavian               arteries.   Assessment and Plan:  1.   Mild to moderate bilateral ICA stenosis as discussed above, remains asymptomatic.  Continue aspirin and Lipitor.  Follow-up carotid Dopplers in May of next year.  2.  Mixed hyperlipidemia, requesting interval lipid panel from Dr. Neita Carp.  3.  Essential hypertension, blood pressure is normal today.  He is also on lisinopril.  Medication Adjustments/Labs and Tests Ordered: Current medicines are reviewed at length with the patient today.  Concerns regarding medicines are outlined above.   Tests Ordered: Orders Placed This Encounter  Procedures   EKG 12-Lead   VAS US CAROTID    Medication Changes: No orders of the defined types were placed in this encounter.   Disposition:  Follow up  1 year, sooner if needed.  Signed, Jonelle Sidle, MD, [facility] 05/21/2022 11:32 AM    [facility] [address] [place], [place], [place] [address] Phone: [phone]; Fax: [phone]

## 2022-05-21 ENCOUNTER — Ambulatory Visit: Payer: Medicare PPO | Attending: Cardiology | Admitting: Cardiology

## 2022-05-21 ENCOUNTER — Encounter: Payer: Self-pay | Admitting: *Deleted

## 2022-05-21 ENCOUNTER — Encounter: Payer: Self-pay | Admitting: Cardiology

## 2022-05-21 VITALS — BP 122/68 | HR 58 | Ht 72.0 in | Wt 194.6 lb

## 2022-05-21 DIAGNOSIS — I6523 Occlusion and stenosis of bilateral carotid arteries: Secondary | ICD-10-CM | POA: Diagnosis not present

## 2022-05-21 DIAGNOSIS — E782 Mixed hyperlipidemia: Secondary | ICD-10-CM

## 2022-05-21 DIAGNOSIS — I10 Essential (primary) hypertension: Secondary | ICD-10-CM

## 2022-05-21 NOTE — Patient Instructions (Addendum)
Medication Instructions:  Your physician recommends that you continue on your current medications as directed. Please refer to the Current Medication list given to you today.  Labwork: none  Testing/Procedures: Your physician has requested that you have a carotid duplex in May 2024. This test is an ultrasound of the carotid arteries in your neck. It looks at blood flow through these arteries that supply the brain with blood. Allow one hour for this exam. There are no restrictions or special instructions.  Follow-Up: Your physician recommends that you schedule a follow-up appointment in: 1 year. You will receive a reminder call in the mail in about 10 months reminding you to call and schedule your appointment. If you don't receive this call, please contact our office.  Any Other Special Instructions Will Be Listed Below (If Applicable).  If you need a refill on your cardiac medications before your next appointment, please call your pharmacy.

## 2022-06-06 ENCOUNTER — Other Ambulatory Visit: Payer: Self-pay | Admitting: Cardiology

## 2022-07-16 DIAGNOSIS — E1165 Type 2 diabetes mellitus with hyperglycemia: Secondary | ICD-10-CM | POA: Diagnosis not present

## 2022-07-16 DIAGNOSIS — E782 Mixed hyperlipidemia: Secondary | ICD-10-CM | POA: Diagnosis not present

## 2022-07-16 DIAGNOSIS — E7849 Other hyperlipidemia: Secondary | ICD-10-CM | POA: Diagnosis not present

## 2022-07-20 DIAGNOSIS — I6930 Unspecified sequelae of cerebral infarction: Secondary | ICD-10-CM | POA: Diagnosis not present

## 2022-07-20 DIAGNOSIS — I10 Essential (primary) hypertension: Secondary | ICD-10-CM | POA: Diagnosis not present

## 2022-07-20 DIAGNOSIS — E1159 Type 2 diabetes mellitus with other circulatory complications: Secondary | ICD-10-CM | POA: Diagnosis not present

## 2022-07-20 DIAGNOSIS — E7849 Other hyperlipidemia: Secondary | ICD-10-CM | POA: Diagnosis not present

## 2022-07-20 DIAGNOSIS — N138 Other obstructive and reflux uropathy: Secondary | ICD-10-CM | POA: Diagnosis not present

## 2022-07-20 DIAGNOSIS — K219 Gastro-esophageal reflux disease without esophagitis: Secondary | ICD-10-CM | POA: Diagnosis not present

## 2022-07-20 DIAGNOSIS — N401 Enlarged prostate with lower urinary tract symptoms: Secondary | ICD-10-CM | POA: Diagnosis not present

## 2022-07-20 DIAGNOSIS — I679 Cerebrovascular disease, unspecified: Secondary | ICD-10-CM | POA: Diagnosis not present

## 2022-07-20 DIAGNOSIS — R609 Edema, unspecified: Secondary | ICD-10-CM | POA: Diagnosis not present

## 2022-07-27 DIAGNOSIS — Z0001 Encounter for general adult medical examination with abnormal findings: Secondary | ICD-10-CM | POA: Diagnosis not present

## 2022-07-27 DIAGNOSIS — E1165 Type 2 diabetes mellitus with hyperglycemia: Secondary | ICD-10-CM | POA: Diagnosis not present

## 2022-07-27 DIAGNOSIS — I10 Essential (primary) hypertension: Secondary | ICD-10-CM | POA: Diagnosis not present

## 2022-07-27 DIAGNOSIS — D519 Vitamin B12 deficiency anemia, unspecified: Secondary | ICD-10-CM | POA: Diagnosis not present

## 2022-07-27 DIAGNOSIS — E119 Type 2 diabetes mellitus without complications: Secondary | ICD-10-CM | POA: Diagnosis not present

## 2022-07-27 DIAGNOSIS — F17210 Nicotine dependence, cigarettes, uncomplicated: Secondary | ICD-10-CM | POA: Diagnosis not present

## 2022-07-27 DIAGNOSIS — E7849 Other hyperlipidemia: Secondary | ICD-10-CM | POA: Diagnosis not present

## 2022-08-13 ENCOUNTER — Encounter: Payer: Self-pay | Admitting: Podiatry

## 2022-08-13 ENCOUNTER — Ambulatory Visit: Payer: Medicare PPO | Admitting: Podiatry

## 2022-08-13 VITALS — BP 148/80

## 2022-08-13 DIAGNOSIS — M79674 Pain in right toe(s): Secondary | ICD-10-CM | POA: Diagnosis not present

## 2022-08-13 DIAGNOSIS — M79675 Pain in left toe(s): Secondary | ICD-10-CM | POA: Diagnosis not present

## 2022-08-13 DIAGNOSIS — B351 Tinea unguium: Secondary | ICD-10-CM

## 2022-08-13 DIAGNOSIS — E1142 Type 2 diabetes mellitus with diabetic polyneuropathy: Secondary | ICD-10-CM

## 2022-08-13 DIAGNOSIS — B070 Plantar wart: Secondary | ICD-10-CM

## 2022-08-13 DIAGNOSIS — L84 Corns and callosities: Secondary | ICD-10-CM

## 2022-08-13 NOTE — Progress Notes (Signed)
  Subjective:  Patient ID: Marcus Carroll, male    DOB: 1946/05/24,   MRN: [id number]  Chief Complaint  Patient presents with   Diabetes    Diabetic foot exam. No concerns voiced today    77 y.o. male presents concern of thickened elongated and painful nails that are difficult to trim. Requesting to have them trimmed today. Relates burning and tingling in their feet. Does have a couple spots that he is concerned about on the bottom of the right foot.   PCP:  Manon Hilding, MD   . Last A1c was 6.2  . Denies any other pedal complaints. Denies n/v/f/c.   PCP: Consuello Masse MD   Past Medical History:  Diagnosis Date   Atrial premature depolarization    Carotid artery disease ([place])    Eczema    Essential hypertension    History of stroke    Right MCA distribution   Hyperlipidemia    Insomnia    Type 2 diabetes mellitus ([place])    Venous insufficiency     Objective:  Physical Exam: Vascular: DP/PT pulses 2/4 bilateral. CFT <3 seconds. Absent hair growth on digits. Edema noted to bilateral lower extremities. Xerosis noted bilaterally.  Skin. No lacerations or abrasions bilateral feet. Nails 1-5 bilateral  are thickened discolored and elongated with subungual debris. Hyperkeratotic cored lesion noted to right fifth digit as well as sub third met head. Additional lesions Multiple capillary budding is noted throughout with cauliflower-like appearance and loss of skin tension lines within the lesion itself. Musculoskeletal: MMT 5/5 bilateral lower extremities in DF, PF, Inversion and Eversion. Deceased ROM in DF of ankle joint.  Neurological: Sensation intact to light touch. Protective sensation diminished bilateral.    Assessment:   1. Pain due to onychomycosis of toenails of both feet   2. Type 2 diabetes mellitus with diabetic polyneuropathy, unspecified whether long term insulin use ([place])   3. Corns and callosities      Plan:  Patient was evaluated and treated and all questions  answered. -Discussed and educated patient on diabetic foot care, especially with  regards to the vascular, neurological and musculoskeletal systems.  -Stressed the importance of good glycemic control and the detriment of not  controlling glucose levels in relation to the foot. -Discussed supportive shoes at all times and checking feet regularly.  -Mechanically debrided all nails 1-5 bilateral using sterile nail nipper and filed with dremel without incident  -Hyperkeratotic tissue debrided as well. One area appears to be more of a wart this area was debrided and with capillary bleeding. Area treated with salycylic acid and band aid. Discussed aftercare. Marland Kitchen  -Answered all patient questions -Patient to return  in 3 months for at risk foot care -Patient advised to call the office if any problems or questions arise in the meantime.   Lorenda Peck, DPM

## 2022-08-25 DIAGNOSIS — S93492A Sprain of other ligament of left ankle, initial encounter: Secondary | ICD-10-CM | POA: Diagnosis not present

## 2022-09-17 DIAGNOSIS — E1121 Type 2 diabetes mellitus with diabetic nephropathy: Secondary | ICD-10-CM | POA: Insufficient documentation

## 2022-09-18 ENCOUNTER — Encounter: Payer: Self-pay | Admitting: Neurology

## 2022-09-18 ENCOUNTER — Ambulatory Visit: Payer: Medicare PPO | Admitting: Neurology

## 2022-09-18 VITALS — BP 126/64 | HR 61 | Ht 72.0 in | Wt 194.4 lb

## 2022-09-18 DIAGNOSIS — R200 Anesthesia of skin: Secondary | ICD-10-CM

## 2022-09-18 DIAGNOSIS — R29898 Other symptoms and signs involving the musculoskeletal system: Secondary | ICD-10-CM

## 2022-09-18 DIAGNOSIS — G629 Polyneuropathy, unspecified: Secondary | ICD-10-CM | POA: Diagnosis not present

## 2022-09-18 DIAGNOSIS — E119 Type 2 diabetes mellitus without complications: Secondary | ICD-10-CM | POA: Diagnosis not present

## 2022-09-18 NOTE — Progress Notes (Signed)
[facility]  PATIENT: Marcus Carroll DOB: 1945-08-09  REFERRING DOCTOR OR PCP: Consuello Masse, MD SOURCE: Patient, notes from primary care, laboratory reports  _________________________________   HISTORICAL  CHIEF COMPLAINT:  Chief Complaint  Patient presents with   New Patient (Initial Visit)    RM 10, alone. Paper referral for neuropathy. Having numb in toes some. Getting more stiff. If sitting for awhile, hard to get up and get moving.     HISTORY OF PRESENT ILLNESS:  I had the pleasure of seeing your patient, Marcus Carroll, at [facility] for neurologic consultation regarding his foot numbness and concern about neuropathy.  He is a 77 year old man who has slowly progressive numbness and paresthesias in his feet.    He denies much discomfort or pain.  Numbness is in the toes and just a little bit into the foot.     Strength seems fine.    He sometimes notes mild tingling in his fingers.     The numbness is constant.  Nothing significantly increases or decreases the symptoms.      He feels his balance is a little worse the past 2 years. This is worse in dim light or if he closes his eyes.   Bladder function is the same as a few years ago.  He has BPH and has nocturia 3 times a night.    He is on terazosin with some benefit.         Laboratory:  03/19/2022: Lipid panel was normal. 01/30/2022 CBC showed minimally elevated white blood cell count.  BUN was minimally elevated.  Creatinine was borderline at 1.15 magnesium was normal.  REVIEW OF SYSTEMS: Constitutional: No fevers, chills, sweats, or change in appetite Eyes: No visual changes, double vision, eye pain Ear, nose and throat: No hearing loss, ear pain, nasal congestion, sore throat Cardiovascular: No chest pain, palpitations Respiratory:  No shortness of breath at rest or with exertion.   No wheezes GastrointestinaI: No nausea, vomiting, diarrhea, abdominal pain, fecal  incontinence Genitourinary:  No dysuria, urinary retention or frequency.  No nocturia. Musculoskeletal:  No neck pain, back pain Integumentary: No rash, pruritus, skin lesions Neurological: as above Psychiatric: No depression at this time.  No anxiety Endocrine: No palpitations, diaphoresis, change in appetite, change in weigh or increased thirst Hematologic/Lymphatic:  No anemia, purpura, petechiae. Allergic/Immunologic: No itchy/runny eyes, nasal congestion, recent allergic reactions, rashes  ALLERGIES: No Known Allergies  HOME MEDICATIONS:  Current Outpatient Medications:    aspirin EC 81 MG tablet, Take 81 mg by mouth daily., Disp: , Rfl:    atorvastatin (LIPITOR) 40 MG tablet, Take 40 mg by mouth daily., Disp: , Rfl:    betamethasone dipropionate 0.05 % cream, Apply 1 Application topically as needed., Disp: , Rfl:    chlorthalidone (HYGROTON) 25 MG tablet, TAKE 1 TABLET (25 MG TOTAL) BY MOUTH DAILY., Disp: 90 tablet, Rfl: 1   Coenzyme Q10 (CO Q 10 PO), Take 1 tablet by mouth daily., Disp: , Rfl:    Cyanocobalamin (B-12 PO), Take 1 tablet by mouth daily., Disp: , Rfl:    dupilumab (DUPIXENT) 300 MG/2ML prefilled syringe, INJECT 300MG (1 SYRINGE) SUBCUTANEOUSLY EVERY 2 WEEKS AS DIRECTED., Disp: 4 mL, Rfl: 6   finasteride (PROSCAR) 5 MG tablet, Take 5 mg by mouth daily., Disp: , Rfl:    fluconazole (DIFLUCAN) 150 MG tablet, Take 150 mg by mouth once., Disp: , Rfl:    KLOR-CON M20 20 MEQ tablet, TAKE 1 TABLET BY MOUTH  EVERY DAY, Disp: 90 tablet, Rfl: 1   lisinopril (ZESTRIL) 10 MG tablet, TAKE 1 TABLET BY MOUTH EVERY DAY, Disp: 90 tablet, Rfl: 2   metFORMIN (GLUCOPHAGE-XR) 500 MG 24 hr tablet, Take 500 mg by mouth daily with breakfast., Disp: , Rfl:    omeprazole (PRILOSEC) 20 MG capsule, Take 1 capsule by mouth daily., Disp: , Rfl:    terazosin (HYTRIN) 2 MG capsule, Take 2 mg by mouth at bedtime., Disp: , Rfl:   PAST MEDICAL HISTORY: Past Medical History:  Diagnosis Date   Atrial  premature depolarization    Carotid artery disease ([employer])    Eczema    Essential hypertension    History of stroke    Right MCA distribution   Hyperlipidemia    Insomnia    Type 2 diabetes mellitus ([place])    Venous insufficiency     PAST SURGICAL HISTORY: Past Surgical History:  Procedure Laterality Date   GALLBLADDER SURGERY  1985    FAMILY HISTORY: Family History  Problem Relation Age of Onset   Heart murmur Father    Heart attack Maternal Grandfather     SOCIAL HISTORY: Social History   Socioeconomic History   Marital status: Married    Spouse name: Not on file   Number of children: 5   Years of education: 18   Highest education level: Not on file  Occupational History   Not on file  Tobacco Use   Smoking status: Former    Types: Cigarettes, Cigars    Passive exposure: Never   Smokeless tobacco: Never  Vaping Use   Vaping Use: Never used  Substance and Sexual Activity   Alcohol use: Yes    Comment: 1-2 drinks per week (beers), Vodka tonic once weekly or less   Drug use: Never   Sexual activity: Not on file  Other Topics Concern   Not on file  Social History Narrative   Left handed   Caffeine use: Drinks several cups coffee daily   Social Determinants of Health   Financial Resource Strain: Not on file  Food Insecurity: Not on file  Transportation Needs: Not on file  Physical Activity: Not on file  Stress: Not on file  Social Connections: Not on file  Intimate Partner Violence: Not on file       PHYSICAL EXAM  Vitals:   09/18/22 1349  BP: 126/64  Pulse: 61  Weight: 194 lb 6.4 oz (88.2 kg)  Height: 6' (1.829 m)    Body mass index is 26.37 kg/m.   General: The patient is well-developed and well-nourished and in no acute distress  HEENT:  Head is [place]/AT.  Sclera are anicteric.     Neck: No carotid bruits are noted.  The neck is nontender.  Cardiovascular: The heart has a regular rate and rhythm with a normal S1 and S2. There were no  murmurs, gallops or rubs.    Skin: Extremities are without rash or  edema.   Neurologic Exam  Mental status: The patient is alert and oriented x 3 at the time of the examination. The patient has apparent normal recent and remote memory, with an apparently normal attention span and concentration ability.   Speech is normal.  Cranial nerves: Extraocular movements are full. Pupils are equal, round, and reactive to light and accomodation.  Visual fields are full.  Facial symmetry is present. There is good facial sensation to soft touch bilaterally.Facial strength is normal.  Trapezius and sternocleidomastoid strength is normal. No dysarthria is  noted.  The tongue is midline, and the patient has symmetric elevation of the soft palate. No obvious hearing deficits are noted.  Motor:  Muscle bulk is normal.   Tone is normal. Strength is  5 / 5 in all 4 extremities.   Sensory: Sensory testing is intact to pinprick, soft touch and vibration sensation in the arms.  He reported fairly intact pinprick sensation in the feet.  However, vibration sensation was only 5 to 10% at the toes and 25% at the ankles compared to 100% at the knees  Coordination: Cerebellar testing reveals good finger-nose-finger and heel-to-shin bilaterally.  Gait and station: Station is normal.   Gait is normal. Tandem gait is normal for age. Romberg is negative.   Reflexes: Deep tendon reflexes are symmetric and normal in the arms and knees but absent at the ankles.   Plantar responses are flexor.        ASSESSMENT AND PLAN  Polyneuropathy - Plan: Vitamin B12, Multiple Myeloma Panel (SPEP&IFE w/QIG), TSH, Sjogren's syndrome antibods(ssa + ssb), Anti-Hu, Anti-Ri, Anti-Yo IFA, Copper, serum  Numbness - Plan: Vitamin B12, Multiple Myeloma Panel (SPEP&IFE w/QIG), TSH, Sjogren's syndrome antibods(ssa + ssb), Anti-Hu, Anti-Ri, Anti-Yo IFA, Copper, serum  Weakness of foot, unspecified laterality  Type 2 diabetes mellitus without  complication, without long-term current use of insulin (HCC)    In summary, Mr. Bonaccorso is a 77 year old man with slowly progressive numbness in his feet.  He does not have painful dysesthesias.  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  Although this can sometimes occur with diabetes (small fiber or mixed small and large fiber are more common), other etiology is also possible.  We need to check vitamin B12 and copper to rule out a deficiency and SPEP/IVF to assess for MGUS.  An antihue/anti-Jo to rule out a paraneoplastic process I will also check SSA/SSB.  We discussed that often an etiology of this type of neuropathy is not found.  If that occurs I would assume that the diabetes is the main culprit though it could also be idiopathic.  I will hold off on checking an NCV/EMG but we consider if he has more rapidly progressive features at the year goes on.  Because he does not have pain associated with this, there is no need for a centrally acting agent such as gabapentin or lamotrigine.  He will give [place] a call if he has new or worsening neurologic symptoms.  We will let him know the results of the studies.  He will return based on the results of the studies or if he has new or worsening symptoms.  Thank you for asking me to see Mr. Madani.  Please let me know if I can be of further assistance with him or other patients in the future.   Gionni Vaca A. Felecia Shelling, MD, [facility] [id number], [id number] PM Certified in Neurology, Clinical Neurophysiology, Sleep Medicine and Neuroimaging  [facility] [address], [place], [place] [address] [phone]

## 2022-09-19 LAB — ANTI-HU, ANTI-RI, ANTI-YO IFA
Anti-Hu Ab: NEGATIVE
Anti-Ri Ab: NEGATIVE
Anti-Yo Ab: NEGATIVE

## 2022-09-25 LAB — MULTIPLE MYELOMA PANEL, SERUM
Albumin SerPl Elph-Mcnc: 4.1 g/dL (ref 2.9–4.4)
Albumin/Glob SerPl: 1.8 — ABNORMAL HIGH (ref 0.7–1.7)
Alpha 1: 0.2 g/dL (ref 0.0–0.4)
Alpha2 Glob SerPl Elph-Mcnc: 0.6 g/dL (ref 0.4–1.0)
B-Globulin SerPl Elph-Mcnc: 0.8 g/dL (ref 0.7–1.3)
Gamma Glob SerPl Elph-Mcnc: 0.8 g/dL (ref 0.4–1.8)
Globulin, Total: 2.4 g/dL (ref 2.2–3.9)
IgA/Immunoglobulin A, Serum: 177 mg/dL (ref 61–437)
IgG (Immunoglobin G), Serum: 845 mg/dL (ref 603–1613)
IgM (Immunoglobulin M), Srm: 55 mg/dL (ref 15–143)
Total Protein: 6.5 g/dL (ref 6.0–8.5)

## 2022-09-25 LAB — SJOGREN'S SYNDROME ANTIBODS(SSA + SSB)
ENA SSA (RO) Ab: <0.2 AI (ref 0.0–0.9)
ENA SSB (LA) Ab: <0.2 AI (ref 0.0–0.9)

## 2022-09-25 LAB — TSH: TSH: 3.83 u[IU]/mL (ref 0.450–4.500)

## 2022-09-25 LAB — COPPER, SERUM: Copper: 96 ug/dL (ref 69–132)

## 2022-09-25 LAB — VITAMIN B12: Vitamin B-12: 1122 pg/mL (ref 232–1245)

## 2022-10-17 DIAGNOSIS — L2082 Flexural eczema: Secondary | ICD-10-CM | POA: Diagnosis not present

## 2022-10-17 DIAGNOSIS — Z79899 Other long term (current) drug therapy: Secondary | ICD-10-CM | POA: Diagnosis not present

## 2022-10-26 DIAGNOSIS — H524 Presbyopia: Secondary | ICD-10-CM | POA: Diagnosis not present

## 2022-10-26 DIAGNOSIS — H43811 Vitreous degeneration, right eye: Secondary | ICD-10-CM | POA: Diagnosis not present

## 2022-11-12 ENCOUNTER — Encounter: Payer: Self-pay | Admitting: Podiatry

## 2022-11-12 ENCOUNTER — Ambulatory Visit: Payer: Medicare PPO | Admitting: Podiatry

## 2022-11-12 DIAGNOSIS — M79674 Pain in right toe(s): Secondary | ICD-10-CM | POA: Diagnosis not present

## 2022-11-12 DIAGNOSIS — B351 Tinea unguium: Secondary | ICD-10-CM

## 2022-11-12 DIAGNOSIS — E1142 Type 2 diabetes mellitus with diabetic polyneuropathy: Secondary | ICD-10-CM | POA: Diagnosis not present

## 2022-11-12 DIAGNOSIS — L84 Corns and callosities: Secondary | ICD-10-CM

## 2022-11-12 DIAGNOSIS — M79675 Pain in left toe(s): Secondary | ICD-10-CM | POA: Diagnosis not present

## 2022-11-12 DIAGNOSIS — B070 Plantar wart: Secondary | ICD-10-CM

## 2022-11-12 NOTE — Progress Notes (Signed)
  Subjective:  Patient ID: Marcus Carroll, male    DOB: Jul 17, 1946,   MRN: [id number]  Chief Complaint  Patient presents with   Nail Problem    Routine foot care     77 y.o. male presents concern of thickened elongated and painful nails that are difficult to trim. Requesting to have them trimmed today. Relates burning and tingling in their feet. Does have a couple spots that he is concerned about on the bottom of the right foot. Relates he has been using compound W on the wart areas.   PCP:  Estanislado Pandy, MD   . Last A1c was 6.2  . Denies any other pedal complaints. Denies n/v/f/c.   PCP: Fara Chute MD   Past Medical History:  Diagnosis Date   Atrial premature depolarization    Carotid artery disease (HCC)    Eczema    Essential hypertension    History of stroke    Right MCA distribution   Hyperlipidemia    Insomnia    Type 2 diabetes mellitus (HCC)    Venous insufficiency     Objective:  Physical Exam: Vascular: DP/PT pulses 2/4 bilateral. CFT <3 seconds. Absent hair growth on digits. Edema noted to bilateral lower extremities. Xerosis noted bilaterally.  Skin. No lacerations or abrasions bilateral feet. Nails 1-5 bilateral  are thickened discolored and elongated with subungual debris. Hyperkeratotic cored lesion noted to right fifth digit as well as sub third met head. Additional lesions Multiple capillary budding is noted throughout with cauliflower-like appearance and loss of skin tension Carroll within the lesion itself. New lesion also noted to left fourth digit plantarly that has capillary budding and cauliflower appearance.  Musculoskeletal: MMT 5/5 bilateral lower extremities in DF, PF, Inversion and Eversion. Deceased ROM in DF of ankle joint.  Neurological: Sensation intact to light touch. Protective sensation diminished bilateral.    Assessment:   1. Pain due to onychomycosis of toenails of both feet   2. Type 2 diabetes mellitus with diabetic polyneuropathy,  unspecified whether long term insulin use   3. Corns and callosities       Plan:  Patient was evaluated and treated and all questions answered. -Discussed and educated patient on diabetic foot care, especially with  regards to the vascular, neurological and musculoskeletal systems.  -Stressed the importance of good glycemic control and the detriment of not  controlling glucose levels in relation to the foot. -Discussed supportive shoes at all times and checking feet regularly.  -Mechanically debrided all nails 1-5 bilateral using sterile nail nipper and filed with dremel without incident  -Hyperkeratotic tissue debrided as well. Two areas appears to be more of a wart this area was debrided and with capillary bleeding. Area treated with salycylic acid and band aid. Discussed aftercare. Marland Kitchen  -Answered all patient questions -Patient to return  in 3 months for at risk foot care -Patient advised to call the office if any problems or questions arise in the meantime.   Louann Sjogren, DPM

## 2022-11-19 DIAGNOSIS — E7849 Other hyperlipidemia: Secondary | ICD-10-CM | POA: Diagnosis not present

## 2022-11-19 DIAGNOSIS — E1165 Type 2 diabetes mellitus with hyperglycemia: Secondary | ICD-10-CM | POA: Diagnosis not present

## 2022-11-26 DIAGNOSIS — K219 Gastro-esophageal reflux disease without esophagitis: Secondary | ICD-10-CM | POA: Diagnosis not present

## 2022-11-26 DIAGNOSIS — I10 Essential (primary) hypertension: Secondary | ICD-10-CM | POA: Diagnosis not present

## 2022-11-26 DIAGNOSIS — R609 Edema, unspecified: Secondary | ICD-10-CM | POA: Diagnosis not present

## 2022-11-26 DIAGNOSIS — I679 Cerebrovascular disease, unspecified: Secondary | ICD-10-CM | POA: Diagnosis not present

## 2022-11-26 DIAGNOSIS — E7849 Other hyperlipidemia: Secondary | ICD-10-CM | POA: Diagnosis not present

## 2022-11-26 DIAGNOSIS — N401 Enlarged prostate with lower urinary tract symptoms: Secondary | ICD-10-CM | POA: Diagnosis not present

## 2022-11-26 DIAGNOSIS — N138 Other obstructive and reflux uropathy: Secondary | ICD-10-CM | POA: Diagnosis not present

## 2022-11-26 DIAGNOSIS — E1159 Type 2 diabetes mellitus with other circulatory complications: Secondary | ICD-10-CM | POA: Diagnosis not present

## 2022-11-26 DIAGNOSIS — I6930 Unspecified sequelae of cerebral infarction: Secondary | ICD-10-CM | POA: Diagnosis not present

## 2022-12-03 ENCOUNTER — Other Ambulatory Visit: Payer: Self-pay | Admitting: Cardiology

## 2022-12-05 ENCOUNTER — Ambulatory Visit: Payer: Medicare PPO | Admitting: Dermatology

## 2022-12-05 ENCOUNTER — Ambulatory Visit: Payer: Medicare PPO | Attending: Cardiology

## 2022-12-05 DIAGNOSIS — I6523 Occlusion and stenosis of bilateral carotid arteries: Secondary | ICD-10-CM | POA: Diagnosis not present

## 2023-01-07 DIAGNOSIS — R030 Elevated blood-pressure reading, without diagnosis of hypertension: Secondary | ICD-10-CM | POA: Diagnosis not present

## 2023-01-07 DIAGNOSIS — J069 Acute upper respiratory infection, unspecified: Secondary | ICD-10-CM | POA: Diagnosis not present

## 2023-01-07 DIAGNOSIS — Z20828 Contact with and (suspected) exposure to other viral communicable diseases: Secondary | ICD-10-CM | POA: Diagnosis not present

## 2023-01-07 DIAGNOSIS — R059 Cough, unspecified: Secondary | ICD-10-CM | POA: Diagnosis not present

## 2023-01-07 DIAGNOSIS — J029 Acute pharyngitis, unspecified: Secondary | ICD-10-CM | POA: Diagnosis not present

## 2023-01-11 DIAGNOSIS — J019 Acute sinusitis, unspecified: Secondary | ICD-10-CM | POA: Diagnosis not present

## 2023-01-11 DIAGNOSIS — R059 Cough, unspecified: Secondary | ICD-10-CM | POA: Diagnosis not present

## 2023-01-11 DIAGNOSIS — R030 Elevated blood-pressure reading, without diagnosis of hypertension: Secondary | ICD-10-CM | POA: Diagnosis not present

## 2023-02-04 DIAGNOSIS — D485 Neoplasm of uncertain behavior of skin: Secondary | ICD-10-CM | POA: Diagnosis not present

## 2023-02-04 DIAGNOSIS — Z1283 Encounter for screening for malignant neoplasm of skin: Secondary | ICD-10-CM | POA: Diagnosis not present

## 2023-02-04 DIAGNOSIS — L2082 Flexural eczema: Secondary | ICD-10-CM | POA: Diagnosis not present

## 2023-02-04 DIAGNOSIS — C4441 Basal cell carcinoma of skin of scalp and neck: Secondary | ICD-10-CM | POA: Diagnosis not present

## 2023-02-04 DIAGNOSIS — L570 Actinic keratosis: Secondary | ICD-10-CM | POA: Diagnosis not present

## 2023-02-04 DIAGNOSIS — Z79899 Other long term (current) drug therapy: Secondary | ICD-10-CM | POA: Diagnosis not present

## 2023-02-11 ENCOUNTER — Encounter: Payer: Self-pay | Admitting: Podiatry

## 2023-02-11 ENCOUNTER — Ambulatory Visit: Payer: Medicare PPO | Admitting: Podiatry

## 2023-02-11 DIAGNOSIS — E1142 Type 2 diabetes mellitus with diabetic polyneuropathy: Secondary | ICD-10-CM | POA: Diagnosis not present

## 2023-02-11 DIAGNOSIS — B070 Plantar wart: Secondary | ICD-10-CM | POA: Diagnosis not present

## 2023-02-11 DIAGNOSIS — M79675 Pain in left toe(s): Secondary | ICD-10-CM | POA: Diagnosis not present

## 2023-02-11 DIAGNOSIS — L84 Corns and callosities: Secondary | ICD-10-CM

## 2023-02-11 DIAGNOSIS — M79674 Pain in right toe(s): Secondary | ICD-10-CM | POA: Diagnosis not present

## 2023-02-11 DIAGNOSIS — B351 Tinea unguium: Secondary | ICD-10-CM | POA: Diagnosis not present

## 2023-02-11 NOTE — Progress Notes (Signed)
  Subjective:  Patient ID: Marcus Carroll, male    DOB: [birth year],   MRN: [id number]  No chief complaint on file.   77 y.o. male presents concern of thickened elongated and painful nails that are difficult to trim. Requesting to have them trimmed today. Relates burning and tingling in their feet. Relates he has been using compound W on the wart areas.   PCP:  Estanislado Pandy, MD   . Last A1c was 6.2  . Denies any other pedal complaints. Denies n/v/f/c.   PCP: Fara Chute MD   Past Medical History:  Diagnosis Date   Atrial premature depolarization    Carotid artery disease (HCC)    Eczema    Essential hypertension    History of stroke    Right MCA distribution   Hyperlipidemia    Insomnia    Type 2 diabetes mellitus (HCC)    Venous insufficiency     Objective:  Physical Exam: Vascular: DP/PT pulses 2/4 bilateral. CFT <3 seconds. Absent hair growth on digits. Edema noted to bilateral lower extremities. Xerosis noted bilaterally.  Skin. No lacerations or abrasions bilateral feet. Nails 1-5 bilateral  are thickened discolored and elongated with subungual debris. Hyperkeratotic cored lesion noted to right fifth digit as well as sub third met head. Additional lesions Multiple capillary budding is noted throughout with cauliflower-like appearance and loss of skin tension lines within the lesion itself. New lesion also noted to left fourth digit plantarly that has capillary budding and cauliflower appearance.  Musculoskeletal: MMT 5/5 bilateral lower extremities in DF, PF, Inversion and Eversion. Deceased ROM in DF of ankle joint.  Neurological: Sensation intact to light touch. Protective sensation diminished bilateral.    Assessment:   1. Pain due to onychomycosis of toenails of both feet   2. Type 2 diabetes mellitus with diabetic polyneuropathy, unspecified whether long term insulin use (HCC)   3. Corns and callosities       Plan:  Patient was evaluated and treated and all  questions answered. -Discussed and educated patient on diabetic foot care, especially with  regards to the vascular, neurological and musculoskeletal systems.  -Stressed the importance of good glycemic control and the detriment of not  controlling glucose levels in relation to the foot. -Discussed supportive shoes at all times and checking feet regularly.  -Mechanically debrided all nails 1-5 bilateral using sterile nail nipper and filed with dremel without incident  -Hyperkeratotic tissue debrided as well without incident. Two areas appears to be more of a wart this area was debrided and with capillary bleeding. Area treated with salycylic acid and band aid. Discussed aftercare. Marland Kitchen  -Answered all patient questions -Patient to return  in 3 months for at risk foot care -Patient advised to call the office if any problems or questions arise in the meantime.   Louann Sjogren, DPM

## 2023-02-18 DIAGNOSIS — Z125 Encounter for screening for malignant neoplasm of prostate: Secondary | ICD-10-CM | POA: Diagnosis not present

## 2023-02-18 DIAGNOSIS — N5201 Erectile dysfunction due to arterial insufficiency: Secondary | ICD-10-CM | POA: Diagnosis not present

## 2023-02-18 DIAGNOSIS — R3911 Hesitancy of micturition: Secondary | ICD-10-CM | POA: Diagnosis not present

## 2023-03-07 DIAGNOSIS — L905 Scar conditions and fibrosis of skin: Secondary | ICD-10-CM | POA: Diagnosis not present

## 2023-03-07 DIAGNOSIS — C4441 Basal cell carcinoma of skin of scalp and neck: Secondary | ICD-10-CM | POA: Diagnosis not present

## 2023-03-12 DIAGNOSIS — M47812 Spondylosis without myelopathy or radiculopathy, cervical region: Secondary | ICD-10-CM | POA: Diagnosis not present

## 2023-03-12 DIAGNOSIS — S233XXA Sprain of ligaments of thoracic spine, initial encounter: Secondary | ICD-10-CM | POA: Diagnosis not present

## 2023-03-12 DIAGNOSIS — M9902 Segmental and somatic dysfunction of thoracic region: Secondary | ICD-10-CM | POA: Diagnosis not present

## 2023-03-12 DIAGNOSIS — M9901 Segmental and somatic dysfunction of cervical region: Secondary | ICD-10-CM | POA: Diagnosis not present

## 2023-03-13 DIAGNOSIS — M47812 Spondylosis without myelopathy or radiculopathy, cervical region: Secondary | ICD-10-CM | POA: Diagnosis not present

## 2023-03-13 DIAGNOSIS — M9902 Segmental and somatic dysfunction of thoracic region: Secondary | ICD-10-CM | POA: Diagnosis not present

## 2023-03-13 DIAGNOSIS — M9901 Segmental and somatic dysfunction of cervical region: Secondary | ICD-10-CM | POA: Diagnosis not present

## 2023-03-13 DIAGNOSIS — S233XXA Sprain of ligaments of thoracic spine, initial encounter: Secondary | ICD-10-CM | POA: Diagnosis not present

## 2023-03-14 DIAGNOSIS — M47812 Spondylosis without myelopathy or radiculopathy, cervical region: Secondary | ICD-10-CM | POA: Diagnosis not present

## 2023-03-14 DIAGNOSIS — S233XXA Sprain of ligaments of thoracic spine, initial encounter: Secondary | ICD-10-CM | POA: Diagnosis not present

## 2023-03-14 DIAGNOSIS — M9902 Segmental and somatic dysfunction of thoracic region: Secondary | ICD-10-CM | POA: Diagnosis not present

## 2023-03-14 DIAGNOSIS — M9901 Segmental and somatic dysfunction of cervical region: Secondary | ICD-10-CM | POA: Diagnosis not present

## 2023-03-19 ENCOUNTER — Other Ambulatory Visit (HOSPITAL_BASED_OUTPATIENT_CLINIC_OR_DEPARTMENT_OTHER): Payer: Self-pay | Admitting: Cardiology

## 2023-03-19 DIAGNOSIS — I6523 Occlusion and stenosis of bilateral carotid arteries: Secondary | ICD-10-CM

## 2023-03-27 DIAGNOSIS — E7849 Other hyperlipidemia: Secondary | ICD-10-CM | POA: Diagnosis not present

## 2023-03-27 DIAGNOSIS — Z1329 Encounter for screening for other suspected endocrine disorder: Secondary | ICD-10-CM | POA: Diagnosis not present

## 2023-03-27 DIAGNOSIS — I10 Essential (primary) hypertension: Secondary | ICD-10-CM | POA: Diagnosis not present

## 2023-03-27 DIAGNOSIS — K219 Gastro-esophageal reflux disease without esophagitis: Secondary | ICD-10-CM | POA: Diagnosis not present

## 2023-03-27 DIAGNOSIS — E1159 Type 2 diabetes mellitus with other circulatory complications: Secondary | ICD-10-CM | POA: Diagnosis not present

## 2023-03-27 DIAGNOSIS — E1165 Type 2 diabetes mellitus with hyperglycemia: Secondary | ICD-10-CM | POA: Diagnosis not present

## 2023-03-27 DIAGNOSIS — D519 Vitamin B12 deficiency anemia, unspecified: Secondary | ICD-10-CM | POA: Diagnosis not present

## 2023-04-03 DIAGNOSIS — E1159 Type 2 diabetes mellitus with other circulatory complications: Secondary | ICD-10-CM | POA: Diagnosis not present

## 2023-04-03 DIAGNOSIS — I679 Cerebrovascular disease, unspecified: Secondary | ICD-10-CM | POA: Diagnosis not present

## 2023-04-03 DIAGNOSIS — R609 Edema, unspecified: Secondary | ICD-10-CM | POA: Diagnosis not present

## 2023-04-03 DIAGNOSIS — I6930 Unspecified sequelae of cerebral infarction: Secondary | ICD-10-CM | POA: Diagnosis not present

## 2023-04-03 DIAGNOSIS — K219 Gastro-esophageal reflux disease without esophagitis: Secondary | ICD-10-CM | POA: Diagnosis not present

## 2023-04-03 DIAGNOSIS — N401 Enlarged prostate with lower urinary tract symptoms: Secondary | ICD-10-CM | POA: Diagnosis not present

## 2023-04-03 DIAGNOSIS — E7849 Other hyperlipidemia: Secondary | ICD-10-CM | POA: Diagnosis not present

## 2023-04-03 DIAGNOSIS — N138 Other obstructive and reflux uropathy: Secondary | ICD-10-CM | POA: Diagnosis not present

## 2023-04-03 DIAGNOSIS — I10 Essential (primary) hypertension: Secondary | ICD-10-CM | POA: Diagnosis not present

## 2023-04-17 DIAGNOSIS — R030 Elevated blood-pressure reading, without diagnosis of hypertension: Secondary | ICD-10-CM | POA: Diagnosis not present

## 2023-04-17 DIAGNOSIS — L03119 Cellulitis of unspecified part of limb: Secondary | ICD-10-CM | POA: Diagnosis not present

## 2023-04-17 DIAGNOSIS — Z6825 Body mass index (BMI) 25.0-25.9, adult: Secondary | ICD-10-CM | POA: Diagnosis not present

## 2023-04-24 DIAGNOSIS — M47812 Spondylosis without myelopathy or radiculopathy, cervical region: Secondary | ICD-10-CM | POA: Diagnosis not present

## 2023-04-24 DIAGNOSIS — S233XXA Sprain of ligaments of thoracic spine, initial encounter: Secondary | ICD-10-CM | POA: Diagnosis not present

## 2023-04-24 DIAGNOSIS — M9902 Segmental and somatic dysfunction of thoracic region: Secondary | ICD-10-CM | POA: Diagnosis not present

## 2023-04-24 DIAGNOSIS — M9901 Segmental and somatic dysfunction of cervical region: Secondary | ICD-10-CM | POA: Diagnosis not present

## 2023-04-27 DIAGNOSIS — M545 Low back pain, unspecified: Secondary | ICD-10-CM | POA: Diagnosis not present

## 2023-05-03 DIAGNOSIS — M545 Low back pain, unspecified: Secondary | ICD-10-CM | POA: Diagnosis not present

## 2023-05-09 DIAGNOSIS — M545 Low back pain, unspecified: Secondary | ICD-10-CM | POA: Diagnosis not present

## 2023-05-10 DIAGNOSIS — M5186 Other intervertebral disc disorders, lumbar region: Secondary | ICD-10-CM | POA: Diagnosis not present

## 2023-05-14 DIAGNOSIS — M5186 Other intervertebral disc disorders, lumbar region: Secondary | ICD-10-CM | POA: Diagnosis not present

## 2023-05-15 ENCOUNTER — Ambulatory Visit: Payer: Medicare PPO | Admitting: Podiatry

## 2023-05-15 ENCOUNTER — Encounter: Payer: Self-pay | Admitting: Podiatry

## 2023-05-15 DIAGNOSIS — E1142 Type 2 diabetes mellitus with diabetic polyneuropathy: Secondary | ICD-10-CM

## 2023-05-15 DIAGNOSIS — B070 Plantar wart: Secondary | ICD-10-CM

## 2023-05-15 DIAGNOSIS — M79675 Pain in left toe(s): Secondary | ICD-10-CM

## 2023-05-15 DIAGNOSIS — L84 Corns and callosities: Secondary | ICD-10-CM

## 2023-05-15 DIAGNOSIS — B351 Tinea unguium: Secondary | ICD-10-CM

## 2023-05-15 DIAGNOSIS — M79674 Pain in right toe(s): Secondary | ICD-10-CM

## 2023-05-15 NOTE — Progress Notes (Signed)
Subjective:  Patient ID: ARLINE PERSON, male    DOB: 1945-12-30,   MRN: [id number]  Chief Complaint  Patient presents with   Nail Problem    Rfc.    77 y.o. male presents concern of thickened elongated and painful nails that are difficult to trim. Requesting to have them trimmed today. Relates burning and tingling in their feet. Relates he has been using compound W on the wart areas.   PCP:  Estanislado Pandy, MD   . Last A1c was 6.2  . Denies any other pedal complaints. Denies n/v/f/c.   PCP: Fara Chute MD   Past Medical History:  Diagnosis Date   Atrial premature depolarization    Carotid artery disease (HCC)    Eczema    Essential hypertension    History of stroke    Right MCA distribution   Hyperlipidemia    Insomnia    Type 2 diabetes mellitus (HCC)    Venous insufficiency     Objective:  Physical Exam: Vascular: DP/PT pulses 2/4 bilateral. CFT <3 seconds. Absent hair growth on digits. Edema noted to bilateral lower extremities. Xerosis noted bilaterally.  Skin. No lacerations or abrasions bilateral feet. Nails 1-5 bilateral  are thickened discolored and elongated with subungual debris. Hyperkeratotic cored lesion noted to right fifth digit as well as sub third met head. Additional lesions Multiple capillary budding is noted throughout with cauliflower-like appearance and loss of skin tension lines within the lesion itself. New lesion also noted to left fourth digit plantarly that has capillary budding and cauliflower appearance.  Musculoskeletal: MMT 5/5 bilateral lower extremities in DF, PF, Inversion and Eversion. Deceased ROM in DF of ankle joint.  Neurological: Sensation intact to light touch. Protective sensation diminished bilateral.    Assessment:   1. Pain due to onychomycosis of toenails of both feet   2. Type 2 diabetes mellitus with diabetic polyneuropathy, unspecified whether long term insulin use (HCC)   3. Corns and callosities   4. Plantar wart of right  foot        Plan:  Patient was evaluated and treated and all questions answered. -Discussed and educated patient on diabetic foot care, especially with  regards to the vascular, neurological and musculoskeletal systems.  -Stressed the importance of good glycemic control and the detriment of not  controlling glucose levels in relation to the foot. -Discussed supportive shoes at all times and checking feet regularly.  -Mechanically debrided all nails 1-5 bilateral using sterile nail nipper and filed with dremel without incident  -Hyperkeratotic tissue debrided as well without incident. Two areas appears to be more of a wart this area was debrided and with capillary bleeding. Area treated with salycylic acid and band aid. Discussed aftercare. Marland Kitchen  -Answered all patient questions -Patient to return  in 3 months for at risk foot care -Patient advised to call the office if any problems or questions arise in the meantime.   Louann Sjogren, DPM

## 2023-05-16 DIAGNOSIS — M5186 Other intervertebral disc disorders, lumbar region: Secondary | ICD-10-CM | POA: Diagnosis not present

## 2023-05-21 DIAGNOSIS — M5186 Other intervertebral disc disorders, lumbar region: Secondary | ICD-10-CM | POA: Diagnosis not present

## 2023-05-22 DIAGNOSIS — M545 Low back pain, unspecified: Secondary | ICD-10-CM | POA: Diagnosis not present

## 2023-05-23 ENCOUNTER — Ambulatory Visit: Payer: Medicare PPO | Admitting: Cardiology

## 2023-05-23 DIAGNOSIS — M5186 Other intervertebral disc disorders, lumbar region: Secondary | ICD-10-CM | POA: Diagnosis not present

## 2023-05-28 DIAGNOSIS — M5186 Other intervertebral disc disorders, lumbar region: Secondary | ICD-10-CM | POA: Diagnosis not present

## 2023-05-30 DIAGNOSIS — M5186 Other intervertebral disc disorders, lumbar region: Secondary | ICD-10-CM | POA: Diagnosis not present

## 2023-06-03 ENCOUNTER — Encounter: Payer: Self-pay | Admitting: Cardiology

## 2023-06-03 ENCOUNTER — Ambulatory Visit: Payer: Medicare PPO | Attending: Cardiology | Admitting: Cardiology

## 2023-06-03 VITALS — BP 120/62 | HR 69 | Ht 72.0 in | Wt 184.2 lb

## 2023-06-03 DIAGNOSIS — E782 Mixed hyperlipidemia: Secondary | ICD-10-CM

## 2023-06-03 DIAGNOSIS — I10 Essential (primary) hypertension: Secondary | ICD-10-CM

## 2023-06-03 DIAGNOSIS — I6523 Occlusion and stenosis of bilateral carotid arteries: Secondary | ICD-10-CM

## 2023-06-03 NOTE — Progress Notes (Signed)
Cardiology Office Note  Date: 06/03/2023   ID: Marcus Carroll, DOB 20-Aug-1945, MRN [id number]  History of Present Illness: Marcus Carroll is a 77 y.o. male last seen in October 2023.  He is here for a routine visit.  Reports no change in stamina or increasing dyspnea on exertion, no sense of palpitations or chest pain.  He has had some back pain, undergoing PT at this time with some improvement.  I reviewed his medications.  Current regimen includes aspirin, Lipitor, chlorthalidone with potassium supplement, and lisinopril.  ECG today shows sinus rhythm with PACs.  I reviewed his available lab work, continues to follow with Dr. Neita Carp.  He also had carotid Dopplers for follow-up in available lab work, continues to follow with Dr. Neita Carp.  He also had carotid Dopplers for follow-up in May of this year.  Physical Exam: VS:  BP 120/62 (BP Location: Left Arm)   Pulse 69   Ht 6' (1.829 m)   Wt 184 lb 3.2 oz (83.6 kg)   SpO2 99%   BMI 24.98 kg/m , BMI Body mass index is 24.98 kg/m.  Wt Readings from Last 3 Encounters:  06/03/23 184 lb 3.2 oz (83.6 kg)  09/18/22 194 lb 6.4 oz (88.2 kg)  05/21/22 194 lb 9.6 oz (88.3 kg)    General: Patient appears comfortable at rest. HEENT: Conjunctiva and lids normal. Neck: Supple, no elevated JVP or carotid bruits. Lungs: Clear to auscultation, nonlabored breathing at rest. Cardiac: Regular rate and rhythm, no S3 or significant systolic murmur. Extremities: No pitting edema.  ECG:  An ECG dated 05/21/2022 was personally reviewed today and demonstrated:  Sinus rhythm with PAC.  Labwork: August 2023: Cholesterol 135, triglycerides 88, HDL 42, LDL 75 09/18/2022: TSH 3.830   Other Studies Reviewed Today:  Carotid Dopplers 12/05/2022: Summary:  Right Carotid: Velocities in the right ICA are consistent with a 1-39%  stenosis.   Left Carotid: Velocities in the left ICA are consistent with a 40-59%  stenosis.   Vertebrals: Bilateral vertebral  arteries demonstrate antegrade flow.  Subclavians: Normal flow hemodynamics were seen in bilateral subclavian               arteries.   Assessment and Plan:  1.  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  He remains asymptomatic.  Continue aspirin and Lipitor.   2.  Mixed hyperlipidemia, LDL 75 in August of last year.  Continue Lipitor.   3.  Primary hypertension.  Blood pressure is well-controlled today.  He is on chlorthalidone with potassium supplement along with lisinopril.  Keep follow-up with Dr. Neita Carp.  4.  Premature atrial complexes, asymptomatic.  Disposition:  Follow up  1 year.  Signed, Jonelle Sidle, M.D., F.A.C.C. [facility]

## 2023-06-03 NOTE — Patient Instructions (Addendum)

## 2023-06-04 DIAGNOSIS — M5186 Other intervertebral disc disorders, lumbar region: Secondary | ICD-10-CM | POA: Diagnosis not present

## 2023-06-06 DIAGNOSIS — M5186 Other intervertebral disc disorders, lumbar region: Secondary | ICD-10-CM | POA: Diagnosis not present

## 2023-06-09 ENCOUNTER — Other Ambulatory Visit: Payer: Self-pay | Admitting: Cardiology

## 2023-06-14 DIAGNOSIS — M5186 Other intervertebral disc disorders, lumbar region: Secondary | ICD-10-CM | POA: Diagnosis not present

## 2023-06-19 DIAGNOSIS — M5186 Other intervertebral disc disorders, lumbar region: Secondary | ICD-10-CM | POA: Diagnosis not present

## 2023-07-02 DIAGNOSIS — R030 Elevated blood-pressure reading, without diagnosis of hypertension: Secondary | ICD-10-CM | POA: Diagnosis not present

## 2023-07-02 DIAGNOSIS — Z6825 Body mass index (BMI) 25.0-25.9, adult: Secondary | ICD-10-CM | POA: Diagnosis not present

## 2023-07-02 DIAGNOSIS — J069 Acute upper respiratory infection, unspecified: Secondary | ICD-10-CM | POA: Diagnosis not present

## 2023-07-24 DIAGNOSIS — E1165 Type 2 diabetes mellitus with hyperglycemia: Secondary | ICD-10-CM | POA: Diagnosis not present

## 2023-07-24 DIAGNOSIS — E7849 Other hyperlipidemia: Secondary | ICD-10-CM | POA: Diagnosis not present

## 2023-07-24 DIAGNOSIS — E1159 Type 2 diabetes mellitus with other circulatory complications: Secondary | ICD-10-CM | POA: Diagnosis not present

## 2023-07-24 DIAGNOSIS — L309 Dermatitis, unspecified: Secondary | ICD-10-CM | POA: Diagnosis not present

## 2023-07-24 DIAGNOSIS — R5383 Other fatigue: Secondary | ICD-10-CM | POA: Diagnosis not present

## 2023-07-24 DIAGNOSIS — Z1329 Encounter for screening for other suspected endocrine disorder: Secondary | ICD-10-CM | POA: Diagnosis not present

## 2023-07-24 DIAGNOSIS — L570 Actinic keratosis: Secondary | ICD-10-CM | POA: Diagnosis not present

## 2023-07-24 DIAGNOSIS — Z85828 Personal history of other malignant neoplasm of skin: Secondary | ICD-10-CM | POA: Diagnosis not present

## 2023-07-24 DIAGNOSIS — D519 Vitamin B12 deficiency anemia, unspecified: Secondary | ICD-10-CM | POA: Diagnosis not present

## 2023-07-24 DIAGNOSIS — K219 Gastro-esophageal reflux disease without esophagitis: Secondary | ICD-10-CM | POA: Diagnosis not present

## 2023-07-29 DIAGNOSIS — N401 Enlarged prostate with lower urinary tract symptoms: Secondary | ICD-10-CM | POA: Diagnosis not present

## 2023-07-29 DIAGNOSIS — I10 Essential (primary) hypertension: Secondary | ICD-10-CM | POA: Diagnosis not present

## 2023-07-29 DIAGNOSIS — E1159 Type 2 diabetes mellitus with other circulatory complications: Secondary | ICD-10-CM | POA: Diagnosis not present

## 2023-07-29 DIAGNOSIS — Z0001 Encounter for general adult medical examination with abnormal findings: Secondary | ICD-10-CM | POA: Diagnosis not present

## 2023-07-29 DIAGNOSIS — E7849 Other hyperlipidemia: Secondary | ICD-10-CM | POA: Diagnosis not present

## 2023-07-29 DIAGNOSIS — R609 Edema, unspecified: Secondary | ICD-10-CM | POA: Diagnosis not present

## 2023-07-29 DIAGNOSIS — N138 Other obstructive and reflux uropathy: Secondary | ICD-10-CM | POA: Diagnosis not present

## 2023-07-29 DIAGNOSIS — K219 Gastro-esophageal reflux disease without esophagitis: Secondary | ICD-10-CM | POA: Diagnosis not present

## 2023-07-29 DIAGNOSIS — I679 Cerebrovascular disease, unspecified: Secondary | ICD-10-CM | POA: Diagnosis not present

## 2023-08-19 ENCOUNTER — Ambulatory Visit: Payer: Medicare PPO | Admitting: Podiatry

## 2023-08-19 ENCOUNTER — Encounter: Payer: Self-pay | Admitting: Podiatry

## 2023-08-19 DIAGNOSIS — L84 Corns and callosities: Secondary | ICD-10-CM

## 2023-08-19 DIAGNOSIS — M79675 Pain in left toe(s): Secondary | ICD-10-CM | POA: Diagnosis not present

## 2023-08-19 DIAGNOSIS — B351 Tinea unguium: Secondary | ICD-10-CM

## 2023-08-19 DIAGNOSIS — E1142 Type 2 diabetes mellitus with diabetic polyneuropathy: Secondary | ICD-10-CM | POA: Diagnosis not present

## 2023-08-19 DIAGNOSIS — M79674 Pain in right toe(s): Secondary | ICD-10-CM | POA: Diagnosis not present

## 2023-08-19 NOTE — Progress Notes (Signed)
  Subjective:  Patient ID: Marcus Carroll, male    DOB: 11-25-1945,   MRN: [id number]  Chief Complaint  Patient presents with   Nail Problem    Dfc.    78 y.o. male presents concern of thickened elongated and painful nails that are difficult to trim. Requesting to have them trimmed today. Relates burning and tingling in their feet. Relates he has been using compound W on the wart areas.   PCP:  Atilano Deward ORN, MD   . Last A1c was 6.2  . Denies any other pedal complaints. Denies n/v/f/c.   PCP: Deward Atilano MD   Past Medical History:  Diagnosis Date   Atrial premature depolarization    Carotid artery disease (HCC)    Eczema    Essential hypertension    History of stroke    Right MCA distribution   Hyperlipidemia    Insomnia    Type 2 diabetes mellitus (HCC)    Venous insufficiency     Objective:  Physical Exam: Vascular: DP/PT pulses 2/4 bilateral. CFT <3 seconds. Absent hair growth on digits. Edema noted to bilateral lower extremities. Xerosis noted bilaterally.  Skin. No lacerations or abrasions bilateral feet. Nails 1-5 bilateral  are thickened discolored and elongated with subungual debris. Hyperkeratotic cored lesion noted to right fifth digit as well as sub third met head. Additional lesions Multiple capillary budding is noted throughout with cauliflower-like appearance and loss of skin tension lines within the lesion itself. New lesion also noted to left fourth digit plantarly that has capillary budding and cauliflower appearance.  Musculoskeletal: MMT 5/5 bilateral lower extremities in DF, PF, Inversion and Eversion. Deceased ROM in DF of ankle joint.  Neurological: Sensation intact to light touch. Protective sensation diminished bilateral.    Assessment:   1. Pain due to onychomycosis of toenails of both feet   2. Type 2 diabetes mellitus with diabetic polyneuropathy, unspecified whether long term insulin  use (HCC)   3. Corns and callosities        Plan:   Patient was evaluated and treated and all questions answered. -Discussed and educated patient on diabetic foot care, especially with  regards to the vascular, neurological and musculoskeletal systems.  -Stressed the importance of good glycemic control and the detriment of not  controlling glucose levels in relation to the foot. -Discussed supportive shoes at all times and checking feet regularly.  -Mechanically debrided all nails 1-5 bilateral using sterile nail nipper and filed with dremel without incident  -Hyperkeratotic tissue debrided as well without incident. Two areas appears to be more of a wart this area was debrided and with capillary bleeding. Area treated with salycylic acid and band aid. Discussed aftercare. SABRA  -Answered all patient questions -Patient to return  in 3 months for at risk foot care -Patient advised to call the office if any problems or questions arise in the meantime.   Asberry Failing, DPM

## 2023-08-23 ENCOUNTER — Ambulatory Visit: Payer: Medicare PPO | Admitting: Neurology

## 2023-08-23 ENCOUNTER — Encounter: Payer: Self-pay | Admitting: Neurology

## 2023-08-23 VITALS — BP 134/69 | HR 63 | Ht 72.0 in | Wt 194.0 lb

## 2023-08-23 DIAGNOSIS — R200 Anesthesia of skin: Secondary | ICD-10-CM | POA: Diagnosis not present

## 2023-08-23 DIAGNOSIS — G629 Polyneuropathy, unspecified: Secondary | ICD-10-CM | POA: Insufficient documentation

## 2023-08-23 DIAGNOSIS — E1121 Type 2 diabetes mellitus with diabetic nephropathy: Secondary | ICD-10-CM

## 2023-08-23 NOTE — Progress Notes (Signed)
[facility]  PATIENT: Marcus Carroll DOB: 07-12-1946  REFERRING DOCTOR OR PCP: Fara Chute, MD SOURCE: Patient, notes from primary care, laboratory reports  _________________________________   HISTORICAL  CHIEF COMPLAINT:  Chief Complaint  Patient presents with   Room 10    Pt is here with his Wife. 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 Pt states that he has numbness and tingling has increased. Pt states that his balance is off when he sits too long. Pt states that his weight has increased after losing 10-20lbs.     HISTORY OF PRESENT ILLNESS:  Marcus Carroll is a 78 y.o. an with neuropathy.   Update 08/23/2023: He reports that the numbness has worsened since 09/18/2022.    Copper, SSA/SSB, TSH, B12, SPEP/IEF, anti Hu/Ri/Yo were all normal or nocontribuatary.    He notes numbness >> pain.  He notes numbness in his feet/toes but not much at ankles.  However, he has a sensation in his lower legs that does not feel right.    THe right toes are > left toes with tingling >> pain.Marland Kitchen  He has had some LBP and did PT a couple months ago with benefit.   LBP is axial into the hips but not below mid thigh.   He is stiff when he stands up.   He denies neck pain now but had a stiff neck in the past.      He has urinary urgency and has 3 x nocturia.     He is a 78 year old man who has slowly progressive numbness and paresthesias in his feet.    He denies much discomfort or pain.  Numbness is in the toes and just a little bit into the foot.     Strength seems fine.    He sometimes notes mild tingling in his fingers.     The numbness is constant.  Nothing significantly increases or decreases the symptoms.      He feels his balance is a little worse the past 2 years. This is worse in dim light or if he closes his eyes.   Bladder function is the same as a few years ago.  He has BPH and has nocturia 3 times a night.    He is on terazosin with some  benefit.         Laboratory:  03/19/2022: Lipid panel was normal. 01/30/2022 CBC showed minimally elevated white blood cell count.  BUN was minimally elevated.  Creatinine was borderline at 1.15 magnesium was normal.  Cervical Xray 09/10/2014 showed Bulky and flowing mid and lower cervical spine anterior endplate osteophytes. Relatively preserved disc spaces.   REVIEW OF SYSTEMS: Constitutional: No fevers, chills, sweats, or change in appetite Eyes: No visual changes, double vision, eye pain Ear, nose and throat: No hearing loss, ear pain, nasal congestion, sore throat Cardiovascular: No chest pain, palpitations Respiratory:  No shortness of breath at rest or with exertion.   No wheezes GastrointestinaI: No nausea, vomiting, diarrhea, abdominal pain, fecal incontinence Genitourinary:  No dysuria, urinary retention or frequency.  No nocturia. Musculoskeletal:  No neck pain, back pain Integumentary: No rash, pruritus, skin lesions Neurological: as above Psychiatric: No depression at this time.  No anxiety Endocrine: No palpitations, diaphoresis, change in appetite, change in weigh or increased thirst Hematologic/Lymphatic:  No anemia, purpura, petechiae. Allergic/Immunologic: No itchy/runny eyes, nasal congestion, recent allergic reactions, rashes  ALLERGIES: No Known Allergies  HOME MEDICATIONS:  Current Outpatient Medications:  aspirin EC 81 MG tablet, Take 81 mg by mouth daily., Disp: , Rfl:    atorvastatin (LIPITOR) 40 MG tablet, Take 40 mg by mouth daily., Disp: , Rfl:    betamethasone dipropionate 0.05 % cream, Apply 1 Application topically as needed., Disp: , Rfl:    chlorthalidone (HYGROTON) 25 MG tablet, TAKE 1 TABLET (25 MG TOTAL) BY MOUTH DAILY., Disp: 90 tablet, Rfl: 1   Coenzyme Q10 (CO Q 10 PO), Take 1 tablet by mouth daily., Disp: , Rfl:    Cyanocobalamin (B-12 PO), Take 1 tablet by mouth daily., Disp: , Rfl:    dupilumab (DUPIXENT) 300 MG/2ML prefilled syringe,  INJECT 300MG  (1 SYRINGE) SUBCUTANEOUSLY EVERY 2 WEEKS AS DIRECTED., Disp: 4 mL, Rfl: 6   finasteride (PROSCAR) 5 MG tablet, Take 5 mg by mouth daily., Disp: , Rfl:    KLOR-CON M20 20 MEQ tablet, TAKE 1 TABLET BY MOUTH EVERY DAY, Disp: 90 tablet, Rfl: 1   lisinopril (ZESTRIL) 10 MG tablet, TAKE 1 TABLET BY MOUTH EVERY DAY, Disp: 90 tablet, Rfl: 2   metFORMIN (GLUCOPHAGE-XR) 500 MG 24 hr tablet, Take 500 mg by mouth daily with breakfast., Disp: , Rfl:    omeprazole (PRILOSEC) 20 MG capsule, Take 1 capsule by mouth daily., Disp: , Rfl:    tamsulosin (FLOMAX) 0.4 MG CAPS capsule, Take 0.4 mg by mouth daily., Disp: , Rfl:   PAST MEDICAL HISTORY: Past Medical History:  Diagnosis Date   Atrial premature depolarization    Carotid artery disease (HCC)    Eczema    Essential hypertension    History of stroke    Right MCA distribution   Hyperlipidemia    Insomnia    Type 2 diabetes mellitus (HCC)    Venous insufficiency     PAST SURGICAL HISTORY: Past Surgical History:  Procedure Laterality Date   GALLBLADDER SURGERY  1985    FAMILY HISTORY: Family History  Problem Relation Age of Onset   Heart murmur Father    Heart attack Maternal Grandfather     SOCIAL HISTORY: Social History   Socioeconomic History   Marital status: Married    Spouse name: Not on file   Number of children: 5   Years of education: 18   Highest education level: Not on file  Occupational History   Not on file  Tobacco Use   Smoking status: Former    Types: Cigarettes, Cigars    Passive exposure: Never   Smokeless tobacco: Never  Vaping Use   Vaping status: Never Used  Substance and Sexual Activity   Alcohol use: Yes    Comment: 1-2 drinks per week (beers), Vodka tonic once weekly or less   Drug use: Never   Sexual activity: Not on file  Other Topics Concern   Not on file  Social History Narrative   Left handed   Caffeine use: Drinks several cups coffee daily   Social Drivers of Manufacturing engineer Strain: Not on file  Food Insecurity: Not on file  Transportation Needs: Not on file  Physical Activity: Not on file  Stress: Not on file  Social Connections: Not on file  Intimate Partner Violence: Not on file       PHYSICAL EXAM  Vitals:   08/23/23 0952  BP: 134/69  Pulse: 63  Weight: 194 lb (88 kg)  Height: 6' (1.829 m)    Body mass index is 26.31 kg/m.   General: The patient is well-developed and well-nourished and in no acute  distress  HEENT:  Head is [place]/AT.  Sclera are anicteric.     Neck: No carotid bruits are noted.  The neck is nontender.  Cardiovascular: The heart has a regular rate and rhythm with a normal S1 and S2. There were no murmurs, gallops or rubs.    Skin: Extremities are without rash or  edema.   Neurologic Exam  Mental status: The patient is alert and oriented x 3 at the time of the examination. The patient has apparent normal recent and remote memory, with an apparently normal attention span and concentration ability.   Speech is normal.  Cranial nerves: Extraocular movements are full. Pupils are equal, round, and reactive to light and accomodation.  Visual fields are full.  Facial symmetry is present. There is good facial sensation to soft touch bilaterally.Facial strength is normal.  Trapezius and sternocleidomastoid strength is normal. No dysarthria is noted.  The tongue is midline, and the patient has symmetric elevation of the soft palate. No obvious hearing deficits are noted.  Motor:  Muscle bulk is normal.   Tone is normal. Strength is  5 / 5 in all 4 extremities.   Sensory: Sensory testing is intact to pinprick, soft touch and vibration sensation in the arms.  He reported fairly intact pinprick sensation in the feet.  However, vibration sensation is near absent at the toes and 25% at the left and 10% at right ankle compared to 100% at the knees  Coordination: Cerebellar testing reveals good finger-nose-finger and  heel-to-shin bilaterally.  Gait and station: Station is normal.   Gait is reduced stride and a 7 step turn . Tandem gait is wide for age. Romberg is negative.   Reflexes: Deep tendon reflexes are symmetric and normal in the arms and knees but absent at the ankles.   Plantar responses are flexor.       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   He has DM but since little to no small fiber involvement, likely unrelated.   Labs 2/24 were normal.  Recheck SPEP.IEF and check RF NCV/EMG to better characterize Rtc 1 year  This visit is part of a comprehensive longitudinal care medical relationship regarding the patients primary diagnosis of neuropathy and related concerns.   Brown Dunlap A. Epimenio Foot, MD, [facility] 08/23/2023, 10:40 AM Certified in Neurology, Clinical Neurophysiology, Sleep Medicine and Neuroimaging  [facility] [address] [place], [place] [address] [phone]

## 2023-08-28 ENCOUNTER — Ambulatory Visit (INDEPENDENT_AMBULATORY_CARE_PROVIDER_SITE_OTHER): Payer: Medicare PPO | Admitting: Neurology

## 2023-08-28 ENCOUNTER — Ambulatory Visit: Payer: Self-pay | Admitting: Neurology

## 2023-08-28 VITALS — Ht 72.0 in

## 2023-08-28 DIAGNOSIS — R200 Anesthesia of skin: Secondary | ICD-10-CM

## 2023-08-28 DIAGNOSIS — R2689 Other abnormalities of gait and mobility: Secondary | ICD-10-CM

## 2023-08-28 DIAGNOSIS — Z0289 Encounter for other administrative examinations: Secondary | ICD-10-CM

## 2023-08-28 DIAGNOSIS — R269 Unspecified abnormalities of gait and mobility: Secondary | ICD-10-CM | POA: Insufficient documentation

## 2023-08-28 DIAGNOSIS — G629 Polyneuropathy, unspecified: Secondary | ICD-10-CM

## 2023-08-28 NOTE — Progress Notes (Signed)
Marcus Carroll is a 78 y.o. 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 Epimenio Foot, proceed with MRI of cervical spine  Will continue follow-up with Dr. Epimenio Foot  DIAGNOSTIC DATA (LABS, IMAGING, TESTING) - I reviewed patient records, labs, notes, testing and imaging myself where available.   MEDICAL HISTORY:  Marcus Carroll, is a 78 year old male, EMG nerve conduction study from Dr. Epimenio Foot for evaluation of bilateral lower extremity paresthesia, his primary care is from [facility]  Neita Carp, Renae Fickle    History is obtained from the patient and review of electronic medical records. I personally reviewed pertinent available imaging films in PACS.   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, walk his dog regularly without significant difficulty, but has to be careful, increased nocturia,  He denies significant neck pain, no upper extremity symptoms  Few months ago, he has a flareup of right leg pain, radiating to right hip, had MRI at [facility], showed degenerative changes, his low back pain has improved with physical therapy  Lab report, normal or negative TSH B12, paraneoplastic panel, copper level, no M protein  X-ray of cervical spine from 2015: Diffuse idiopathic skeletal and hypeosteosis,  EMG nerve conduction study today showed no evidence of large fiber peripheral neuropathy,   PHYSICAL EXAM:      08/28/2023    7:26 AM 08/23/2023    9:52 AM 06/03/2023    4:28 PM  Vitals with BMI   Height 6\' 0"  6\' 0"  6\' 0"   Weight  194 lbs 184 lbs 3 oz  BMI  26.31 24.98  Systolic  134 120  Diastolic  69 62  Pulse  63 69    PHYSICAL EXAMNIATION:  Gen: NAD, conversant, well nourised, well groomed                     Cardiovascular: Regular rate rhythm, no peripheral edema, warm, nontender. Eyes: Conjunctivae clear without exudates or hemorrhage Neck: Supple, no carotid bruits. Pulmonary: Clear to auscultation bilaterally   NEUROLOGICAL EXAM:  MENTAL STATUS: Speech/cognition: Awake, alert, oriented to history taking and casual conversation CRANIAL NERVES: CN II: Visual fields are full to confrontation. Pupils are round equal and briskly reactive to light. CN III, IV, VI: extraocular movement are normal. No ptosis. CN V: Facial sensation is intact to light touch CN VII: Face is symmetric with normal eye closure  CN VIII: Hearing is normal to causal conversation. CN IX, X: Phonation is normal. CN XI: Head turning and shoulder shrug are intact  MOTOR: Moderate bilateral lower extremity spasticity, no significant upper extremity weakness, mild bilateral hip flexion weakness  REFLEXES: Reflexes are 2+ and symmetric at the biceps, triceps, 3/3 knees, and ankles. Plantar responses are extensor bilaterally  SENSORY: Mild lower extremity pitting edema, mild length-dependent decreased vibratory sensation to distal shin, decreased pin prick to midfoot level  COORDINATION: There is no trunk or limb dysmetria noted.  GAIT/STANCE: Push-up to get up from seated position, stiff, cautious, mildly unsteady  REVIEW OF  SYSTEMS:  Full 14 system review of systems performed and notable only for as above All other review of systems were negative.   ALLERGIES: No Known Allergies  HOME MEDICATIONS: Current Outpatient Medications  Medication Sig Dispense Refill   aspirin EC 81 MG tablet Take 81 mg by mouth daily.     atorvastatin (LIPITOR) 40 MG tablet Take 40 mg by mouth daily.      betamethasone dipropionate 0.05 % cream Apply 1 Application topically as needed.     chlorthalidone (HYGROTON) 25 MG tablet TAKE 1 TABLET (25 MG TOTAL) BY MOUTH DAILY. 90 tablet 1   Coenzyme Q10 (CO Q 10 PO) Take 1 tablet by mouth daily.     Cyanocobalamin (B-12 PO) Take 1 tablet by mouth daily.     dupilumab (DUPIXENT) 300 MG/2ML prefilled syringe INJECT 300MG  (1 SYRINGE) SUBCUTANEOUSLY EVERY 2 WEEKS AS DIRECTED. 4 mL 6   finasteride (PROSCAR) 5 MG tablet Take 5 mg by mouth daily.     KLOR-CON M20 20 MEQ tablet TAKE 1 TABLET BY MOUTH EVERY DAY 90 tablet 1   lisinopril (ZESTRIL) 10 MG tablet TAKE 1 TABLET BY MOUTH EVERY DAY 90 tablet 2   metFORMIN (GLUCOPHAGE-XR) 500 MG 24 hr tablet Take 500 mg by mouth daily with breakfast.     omeprazole (PRILOSEC) 20 MG capsule Take 1 capsule by mouth daily.     tamsulosin (FLOMAX) 0.4 MG CAPS capsule Take 0.4 mg by mouth daily.     No current facility-administered medications for this visit.    PAST MEDICAL HISTORY: Past Medical History:  Diagnosis Date   Atrial premature depolarization    Carotid artery disease (HCC)    Eczema    Essential hypertension    History of stroke    Right MCA distribution   Hyperlipidemia    Insomnia    Type 2 diabetes mellitus (HCC)    Venous insufficiency     PAST SURGICAL HISTORY: Past Surgical History:  Procedure Laterality Date   GALLBLADDER SURGERY  1985    FAMILY HISTORY: Family History  Problem Relation Age of Onset   Heart murmur Father    Heart attack Maternal Grandfather     SOCIAL HISTORY: Social History   Socioeconomic History   Marital status: Married    Spouse name: Not on file   Number of children: 5   Years of education: 18   Highest education level: Not on file  Occupational History   Not on file  Tobacco Use   Smoking status: Former    Types: Cigarettes, Cigars    Passive exposure: Never   Smokeless tobacco: Never  Vaping Use   Vaping status: Never Used  Substance and  Sexual Activity   Alcohol use: Yes    Comment: 1-2 drinks per week (beers), Vodka tonic once weekly or less   Drug use: Never   Sexual activity: Not on file  Other Topics Concern   Not on file  Social History Narrative   Left handed   Caffeine use: Drinks several cups coffee daily   Social Drivers of Corporate investment banker Strain: Not on file  Food Insecurity: Not on file  Transportation Needs: Not on file  Physical Activity: Not on file  Stress: Not on file  Social Connections: Not on file  Intimate Partner Violence: Not on file      Levert Feinstein, M.D. Ph.D.  [facility] [address], [address] [place], [place] [address] Ph: [phone] Fax: [phone]  CC:  Sasser, Clarene Critchley, MD [address] [place],  [place] [address]  Estanislado Pandy, MD

## 2023-08-28 NOTE — Procedures (Signed)
Full Name: Marcus Carroll Gender: Male MRN #: [id number] Date of Birth: 24-Jan-1946    Visit Date: 08/28/2023 07:22 Age: 78 Years Examining Physician: Dr. Levert Feinstein Referring Physician: Dr. Despina Arias Height: 6 feet 0 inch History: 78 year old male presenting with slow onset bilateral lower extremity paresthesia, gait abnormality  Summary of the test:  Nerve conduction study:  He has mild bilateral lower extremity pitting edema, despite that, bilateral sural, superficial peroneal sensory responses showed no significant abnormality.  Bilateral peroneal to EDB, tibial motor responses were normal.  Right median sensory response showed mildly prolonged peak latency well-preserved snap amplitude.  Right median motor responses were normal.  Right ulnar sensory and motor responses were normal.  Electromyography: Selected needle examination of bilateral lower extremity muscles, lumbosacral paraspinal muscles showed no significant abnormality.   Conclusion: This is a slight abnormal study.  There is no evidence of large fiber peripheral neuropathy, there is evidence of mild right median neuropathy across the wrist consistent with mild right carpal tunnel syndromes.  There is no evidence of active bilateral lumbosacral radiculopathy.   Levert Feinstein. M.D. Ph.D.   [facility] [address] [place], [place] [address] Tel: [phone] Fax: [phone]  Verbal informed consent was obtained from the patient, patient was informed of potential risk of procedure, including bruising, bleeding, hematoma formation, infection, muscle weakness, muscle pain, numbness, among others.        MNC    Nerve / Sites Muscle Latency Ref. Amplitude Ref. Rel Amp Segments Distance Velocity Ref. Area    ms ms mV mV %  cm m/s m/s mVms  R Median - APB     Wrist APB 3.8 <=4.4 7.9 >=4.0 100 Wrist - APB 7   37.0     Upper arm APB 8.8  7.2  90.6 Upper arm - Wrist 26 52 >=49  34.1  R Ulnar - ADM     Wrist ADM 3.1 <=3.3 6.9 >=6.0 100 Wrist - ADM 7   28.3     B.Elbow ADM 5.1  5.6  80.8 B.Elbow - Wrist 11 55 >=49 23.6     A.Elbow ADM 8.7  5.8  104 A.Elbow - B.Elbow 19.6 54 >=49 26.0  R Peroneal - EDB     Ankle EDB 5.7 <=6.5 2.0 >=2.0 100 Ankle - EDB 9   7.9     Fib head EDB 13.5  2.1  104 Fib head - Ankle 29 37 >=44 8.2     Pop fossa EDB 15.8  2.0  99 Pop fossa - Fib head 11 49 >=44 8.7         Pop fossa - Ankle      L Peroneal - EDB     Ankle EDB 5.2 <=6.5 3.8 >=2.0 100 Ankle - EDB 9   16.8     Fib head EDB 12.5  3.4  88.3 Fib head - Ankle 30 41 >=44 15.7     Pop fossa EDB 16.5  3.3  96.1 Pop fossa - Fib head 17 42 >=44 16.0         Acc Peron - Pop fossa      R Tibial - AH     Ankle AH 5.3 <=5.8 5.6 >=4.0 100 Ankle - AH 9   24.3     Pop fossa AH 15.6  6.3  112 Pop fossa - Ankle 43 42 >=41 31.5  L Tibial - AH     Ankle AH  5.1 <=5.8 10.7 >=4.0 100 Ankle - AH 9   27.1     Pop fossa AH 17.2  7.0  65.4 Pop fossa - Ankle 53 44 >=41 25.2                 SNC    Nerve / Sites Rec. Site Peak Lat Ref.  Amp Ref. Segments Distance    ms ms V V  cm  R Sural - Ankle (Calf)     Calf Ankle 3.9 <=4.4 5 >=6 Calf - Ankle 9  L Sural - Ankle (Calf)     Calf Ankle 3.2 <=4.4 6 >=6 Calf - Ankle 14  R Superficial peroneal - Ankle     Lat leg Ankle 4.1 <=4.4 5 >=6 Lat leg - Ankle 14  L Superficial peroneal - Ankle     Lat leg Ankle 4.2 <=4.4 4 >=6 Lat leg - Ankle 14  R Median - Orthodromic (Dig II, Mid palm)     Dig II Wrist 3.5 <=3.4 10 >=10 Dig II - Wrist 13  R Ulnar - Orthodromic, (Dig V, Mid palm)     Dig V Wrist 3.1 <=3.1 11 >=5 Dig V - Wrist 29                 F  Wave    Nerve F Lat Ref.   ms ms  R Tibial - AH 63.3 <=56.0  R Ulnar - ADM 30.1 <=32.0  L Tibial - AH 47.2 <=56.0           EMG Summary Table    Spontaneous MUAP Recruitment  Muscle IA Fib PSW Fasc Other Amp Dur. Poly Pattern  R. Tibialis anterior Normal None None None _______ Normal Normal Normal  Normal  R. Tibialis posterior Normal None None None _______ Normal Normal Normal Normal  R. Peroneus longus Normal None None None _______ Normal Normal Normal Normal  R. Gastrocnemius (Medial head) Normal None None None _______ Normal Normal Normal Normal  R. Vastus lateralis Normal None None None _______ Normal Normal Normal Normal  L. Tibialis anterior Normal None None None _______ Normal Normal Normal Normal  L. Tibialis posterior Normal None None None _______ Normal Normal Normal Normal  L. Peroneus longus Normal None None None _______ Normal Normal Normal Normal  L. Gastrocnemius (Medial head) Normal None None None _______ Normal Normal Normal Normal  L. Vastus lateralis Normal None None None _______ Normal Normal Normal Normal  R. Lumbar paraspinals (low) Normal None None None _______ Normal Normal Normal Normal  R. Lumbar paraspinals (mid) Normal None None None _______ Normal Normal Normal Normal  L. Lumbar paraspinals (low) Normal None None None _______ Normal Normal Normal Normal  L. Lumbar paraspinals (mid) Normal None None None _______ Normal Normal Normal Normal

## 2023-09-01 NOTE — Progress Notes (Signed)
EMG nerve conduction study report is under procedure tab

## 2023-09-02 ENCOUNTER — Encounter: Payer: Self-pay | Admitting: Neurology

## 2023-09-02 LAB — MULTIPLE MYELOMA PANEL, SERUM
Albumin SerPl Elph-Mcnc: 4.1 g/dL (ref 2.9–4.4)
Albumin/Glob SerPl: 1.5 (ref 0.7–1.7)
Alpha 1: 0.2 g/dL (ref 0.0–0.4)
Alpha2 Glob SerPl Elph-Mcnc: 0.7 g/dL (ref 0.4–1.0)
B-Globulin SerPl Elph-Mcnc: 1 g/dL (ref 0.7–1.3)
Gamma Glob SerPl Elph-Mcnc: 0.9 g/dL (ref 0.4–1.8)
Globulin, Total: 2.8 g/dL (ref 2.2–3.9)
IgA/Immunoglobulin A, Serum: 194 mg/dL (ref 61–437)
IgG (Immunoglobin G), Serum: 916 mg/dL (ref 603–1613)
IgM (Immunoglobulin M), Srm: 61 mg/dL (ref 15–143)
Total Protein: 6.9 g/dL (ref 6.0–8.5)

## 2023-09-02 LAB — RHEUMATOID FACTOR: Rheumatoid fact SerPl-aCnc: <10 [IU]/mL (ref <14.0)

## 2023-09-03 ENCOUNTER — Telehealth: Payer: Self-pay | Admitting: Neurology

## 2023-09-03 NOTE — Telephone Encounter (Signed)
Marcus Carroll: [id number] exp. 09/03/23-11/02/23 sent to GI [phone]

## 2023-09-16 ENCOUNTER — Ambulatory Visit
Admission: RE | Admit: 2023-09-16 | Discharge: 2023-09-16 | Disposition: A | Discharge status: Home / Self Care | Payer: Medicare PPO | Source: Ambulatory Visit | Attending: Neurology | Admitting: Neurology

## 2023-09-16 DIAGNOSIS — R269 Unspecified abnormalities of gait and mobility: Secondary | ICD-10-CM

## 2023-09-16 DIAGNOSIS — R200 Anesthesia of skin: Secondary | ICD-10-CM

## 2023-09-19 ENCOUNTER — Encounter: Payer: Self-pay | Admitting: Neurology

## 2023-10-07 DIAGNOSIS — Z85828 Personal history of other malignant neoplasm of skin: Secondary | ICD-10-CM | POA: Diagnosis not present

## 2023-10-07 DIAGNOSIS — L209 Atopic dermatitis, unspecified: Secondary | ICD-10-CM | POA: Diagnosis not present

## 2023-10-07 DIAGNOSIS — L570 Actinic keratosis: Secondary | ICD-10-CM | POA: Diagnosis not present

## 2023-10-07 DIAGNOSIS — Z79899 Other long term (current) drug therapy: Secondary | ICD-10-CM | POA: Diagnosis not present

## 2023-10-28 DIAGNOSIS — H43811 Vitreous degeneration, right eye: Secondary | ICD-10-CM | POA: Diagnosis not present

## 2023-11-18 DIAGNOSIS — Z0000 Encounter for general adult medical examination without abnormal findings: Secondary | ICD-10-CM | POA: Diagnosis not present

## 2023-11-18 DIAGNOSIS — R739 Hyperglycemia, unspecified: Secondary | ICD-10-CM | POA: Diagnosis not present

## 2023-11-18 DIAGNOSIS — Z1349 Encounter for screening for other developmental delays: Secondary | ICD-10-CM | POA: Diagnosis not present

## 2023-11-18 DIAGNOSIS — E119 Type 2 diabetes mellitus without complications: Secondary | ICD-10-CM | POA: Diagnosis not present

## 2023-11-18 DIAGNOSIS — Z13220 Encounter for screening for lipoid disorders: Secondary | ICD-10-CM | POA: Diagnosis not present

## 2023-11-18 DIAGNOSIS — Z1321 Encounter for screening for nutritional disorder: Secondary | ICD-10-CM | POA: Diagnosis not present

## 2023-11-18 DIAGNOSIS — E559 Vitamin D deficiency, unspecified: Secondary | ICD-10-CM | POA: Diagnosis not present

## 2023-11-20 ENCOUNTER — Ambulatory Visit: Payer: Medicare PPO | Admitting: Podiatry

## 2023-11-20 DIAGNOSIS — M79674 Pain in right toe(s): Secondary | ICD-10-CM | POA: Diagnosis not present

## 2023-11-20 DIAGNOSIS — L84 Corns and callosities: Secondary | ICD-10-CM

## 2023-11-20 DIAGNOSIS — B351 Tinea unguium: Secondary | ICD-10-CM

## 2023-11-20 DIAGNOSIS — B070 Plantar wart: Secondary | ICD-10-CM | POA: Diagnosis not present

## 2023-11-20 DIAGNOSIS — E1142 Type 2 diabetes mellitus with diabetic polyneuropathy: Secondary | ICD-10-CM

## 2023-11-20 DIAGNOSIS — M79675 Pain in left toe(s): Secondary | ICD-10-CM

## 2023-11-20 NOTE — Progress Notes (Signed)
  Subjective:  Patient ID: Marcus Carroll, male    DOB: [birth year],   MRN: [id number]  No chief complaint on file.   78 y.o. male presents concern of thickened elongated and painful nails that are difficult to trim. Requesting to have them trimmed today. Relates burning and tingling in their feet. Relates he has been using compound W on the wart areas.   PCP:  Orest Bio, MD   . Last A1c was 6.2  . Denies any other pedal complaints. Denies n/v/f/c.   PCP: Alanna Hu MD   Past Medical History:  Diagnosis Date   Atrial premature depolarization    Carotid artery disease (HCC)    Eczema    Essential hypertension    History of stroke    Right MCA distribution   Hyperlipidemia    Insomnia    Type 2 diabetes mellitus (HCC)    Venous insufficiency     Objective:  Physical Exam: Vascular: DP/PT pulses 2/4 bilateral. CFT <3 seconds. Absent hair growth on digits. Edema noted to bilateral lower extremities. Xerosis noted bilaterally.  Skin. No lacerations or abrasions bilateral feet. Nails 1-5 bilateral  are thickened discolored and elongated with subungual debris. Hyperkeratotic cored lesion noted to right fifth digit as well as sub third met head. Additional lesions Multiple capillary budding is noted throughout with cauliflower-like appearance and loss of skin tension lines within the lesion itself. New lesion also noted to left fourth digit plantarly that has capillary budding and cauliflower appearance.  Musculoskeletal: MMT 5/5 bilateral lower extremities in DF, PF, Inversion and Eversion. Deceased ROM in DF of ankle joint.  Neurological: Sensation intact to light touch. Protective sensation diminished bilateral.    Assessment:   1. Pain due to onychomycosis of toenails of both feet   2. Type 2 diabetes mellitus with diabetic polyneuropathy, unspecified whether long term insulin use (HCC)   3. Corns and callosities   4. Plantar wart of right foot         Plan:  Patient  was evaluated and treated and all questions answered. -Discussed and educated patient on diabetic foot care, especially with  regards to the vascular, neurological and musculoskeletal systems.  -Stressed the importance of good glycemic control and the detriment of not  controlling glucose levels in relation to the foot. -Discussed supportive shoes at all times and checking feet regularly.  -Mechanically debrided all nails 1-5 bilateral using sterile nail nipper and filed with dremel without incident  -Hyperkeratotic tissue debrided as well without incident. Two areas appears to be more of a wart this area was debrided and with capillary bleeding. Area treated with salycylic acid and band aid. Discussed aftercare.   -Answered all patient questions -Patient to return  in 3 months for at risk foot care -Patient advised to call the office if any problems or questions arise in the meantime.   Jennefer Moats, DPM

## 2023-11-25 DIAGNOSIS — G629 Polyneuropathy, unspecified: Secondary | ICD-10-CM | POA: Diagnosis not present

## 2023-11-25 DIAGNOSIS — E119 Type 2 diabetes mellitus without complications: Secondary | ICD-10-CM | POA: Diagnosis not present

## 2023-11-25 DIAGNOSIS — N401 Enlarged prostate with lower urinary tract symptoms: Secondary | ICD-10-CM | POA: Diagnosis not present

## 2023-11-25 DIAGNOSIS — Z6826 Body mass index (BMI) 26.0-26.9, adult: Secondary | ICD-10-CM | POA: Diagnosis not present

## 2023-11-25 DIAGNOSIS — E1140 Type 2 diabetes mellitus with diabetic neuropathy, unspecified: Secondary | ICD-10-CM | POA: Diagnosis not present

## 2023-12-08 ENCOUNTER — Other Ambulatory Visit: Payer: Self-pay | Admitting: Cardiology

## 2023-12-18 ENCOUNTER — Encounter: Payer: Self-pay | Admitting: Neurology

## 2023-12-18 ENCOUNTER — Ambulatory Visit: Payer: Medicare PPO | Admitting: Neurology

## 2023-12-18 VITALS — BP 126/55 | HR 63 | Ht 72.0 in | Wt 191.0 lb

## 2023-12-18 DIAGNOSIS — R200 Anesthesia of skin: Secondary | ICD-10-CM

## 2023-12-18 DIAGNOSIS — Z7984 Long term (current) use of oral hypoglycemic drugs: Secondary | ICD-10-CM | POA: Diagnosis not present

## 2023-12-18 DIAGNOSIS — R269 Unspecified abnormalities of gait and mobility: Secondary | ICD-10-CM | POA: Diagnosis not present

## 2023-12-18 DIAGNOSIS — E1121 Type 2 diabetes mellitus with diabetic nephropathy: Secondary | ICD-10-CM

## 2023-12-18 NOTE — Addendum Note (Signed)
 Addended by: Hortensia Ma A on: 12/18/2023 08:20 PM   Modules accepted: Level of Service

## 2023-12-18 NOTE — Progress Notes (Addendum)
 [facility]  PATIENT: Marcus Carroll DOB: 02-23-1946  REFERRING DOCTOR OR PCP: Alanna Hu, MD SOURCE: Patient, notes from primary care, laboratory reports  _________________________________   HISTORICAL  CHIEF COMPLAINT:  Chief Complaint  Patient presents with   Follow-up    Pt in 11 alone Pt here for neuropathy pain  Pt states numbness ,tingling in both feet and toes and hands     HISTORY OF PRESENT ILLNESS:  Marcus Carroll is a 78 y.o. an with neuropathy.   Update 12/18/2023 Since his last visit, he has had NCV/EMG NOT showing polyneuropathy.    MRI cervical spine showed DJD but no spinal cord change or significant spinal stenosis  Previously we checked, Copper , SSA/SSB, TSH, B12, SPEP/IEF, anti Hu/Ri/Yo and these were all normal or nocontribuatary.    He continues to note numbness but no pain.  He notes numbness in his feet/toes but not much at ankles.  He has some numbness in finger tips. .  He has had some LBP and did PT a couple months ago with benefit.   LBP is axial into the hips but not below mid thigh.   He is stiff when he stands up.   He denies neck pain now but had a stiff neck in the past.    Symptoms  He has urinary urgency and has 3 x nocturia.   This is stable or slightly worse compared to last year.  No incontinence.   He also has BPH and is on terazosin with some benefit.    He feels his balance is a little worse the past 2 years. This is worse in dim light or if he closes his eyes.   Bladder function is the same as a few years ago.     IMAGING MRI cervical spine 09/16/2023 without contrast demonstrating: - At C3-4 disc bulging and uncovertebral joint hypertrophy with moderate right foraminal stenosis and borderline mild spinal stenosis. - At C6-7 minimal disc bulging and facet hypertrophy with borderline mild spinal stenosis and no foraminal narrowing. - No cord signal abnormalities.  Laboratory:  03/19/2022: Lipid panel was  normal. 01/30/2022 CBC showed minimally elevated white blood cell count.  BUN was minimally elevated.  Creatinine was borderline at 1.15 magnesium was normal.  Cervical Xray 09/10/2014 showed Bulky and flowing mid and lower cervical spine anterior endplate osteophytes. Relatively preserved disc spaces.   REVIEW OF SYSTEMS: Constitutional: No fevers, chills, sweats, or change in appetite Eyes: No visual changes, double vision, eye pain Ear, nose and throat: No hearing loss, ear pain, nasal congestion, sore throat Cardiovascular: No chest pain, palpitations Respiratory:  No shortness of breath at rest or with exertion.   No wheezes GastrointestinaI: No nausea, vomiting, diarrhea, abdominal pain, fecal incontinence Genitourinary:  No dysuria, urinary retention or frequency.  No nocturia. Musculoskeletal:  No neck pain, back pain Integumentary: No rash, pruritus, skin lesions Neurological: as above Psychiatric: No depression at this time.  No anxiety Endocrine: No palpitations, diaphoresis, change in appetite, change in weigh or increased thirst Hematologic/Lymphatic:  No anemia, purpura, petechiae. Allergic/Immunologic: No itchy/runny eyes, nasal congestion, recent allergic reactions, rashes  ALLERGIES: No Known Allergies  HOME MEDICATIONS:  Current Outpatient Medications:    ALPHA LIPOIC ACID PO, Take by mouth., Disp: , Rfl:    aspirin EC 81 MG tablet, Take 81 mg by mouth daily., Disp: , Rfl:    atorvastatin (LIPITOR) 40 MG tablet, Take 40 mg by mouth daily., Disp: , Rfl:    betamethasone   dipropionate 0.05 % cream, Apply 1 Application topically as needed., Disp: , Rfl:    chlorthalidone  (HYGROTON ) 25 MG tablet, TAKE 1 TABLET (25 MG TOTAL) BY MOUTH DAILY., Disp: 90 tablet, Rfl: 1   Coenzyme Q10 (CO Q 10 PO), Take 1 tablet by mouth daily., Disp: , Rfl:    Cyanocobalamin  (B-12 PO), Take 1 tablet by mouth daily., Disp: , Rfl:    dupilumab  (DUPIXENT ) 300 MG/2ML prefilled syringe, INJECT 300MG   (1 SYRINGE) SUBCUTANEOUSLY EVERY 2 WEEKS AS DIRECTED., Disp: 4 mL, Rfl: 6   finasteride (PROSCAR) 5 MG tablet, Take 5 mg by mouth daily., Disp: , Rfl:    KLOR-CON  M20 20 MEQ tablet, TAKE 1 TABLET BY MOUTH EVERY DAY, Disp: 90 tablet, Rfl: 1   lisinopril  (ZESTRIL ) 10 MG tablet, TAKE 1 TABLET BY MOUTH EVERY DAY, Disp: 90 tablet, Rfl: 2   metFORMIN (GLUCOPHAGE-XR) 500 MG 24 hr tablet, Take 500 mg by mouth daily with breakfast., Disp: , Rfl:    omeprazole (PRILOSEC) 20 MG capsule, Take 1 capsule by mouth daily., Disp: , Rfl:    tamsulosin (FLOMAX) 0.4 MG CAPS capsule, Take 0.4 mg by mouth daily., Disp: , Rfl:   PAST MEDICAL HISTORY: Past Medical History:  Diagnosis Date   Atrial premature depolarization    Carotid artery disease (HCC)    Eczema    Essential hypertension    History of stroke    Right MCA distribution   Hyperlipidemia    Insomnia    Type 2 diabetes mellitus (HCC)    Venous insufficiency     PAST SURGICAL HISTORY: Past Surgical History:  Procedure Laterality Date   GALLBLADDER SURGERY  1985    FAMILY HISTORY: Family History  Problem Relation Age of Onset   Heart murmur Father    Heart attack Maternal Grandfather    Neuropathy Neg Hx     SOCIAL HISTORY: Social History   Socioeconomic History   Marital status: Married    Spouse name: Not on file   Number of children: 5   Years of education: 18   Highest education level: Not on file  Occupational History   Not on file  Tobacco Use   Smoking status: Former    Types: Cigarettes, Cigars    Passive exposure: Never   Smokeless tobacco: Never  Vaping Use   Vaping status: Never Used  Substance and Sexual Activity   Alcohol use: Yes    Comment: 1-2 drinks per week (beers), Vodka tonic once weekly or less   Drug use: Never   Sexual activity: Not on file  Other Topics Concern   Not on file  Social History Narrative   Left handed   Caffeine use: Drinks several cups coffee daily   Semi retired    lives with  wife    Social Drivers of Corporate investment banker Strain: Not on file  Food Insecurity: Not on file  Transportation Needs: Not on file  Physical Activity: Not on file  Stress: Not on file  Social Connections: Not on file  Intimate Partner Violence: Not on file       PHYSICAL EXAM  Vitals:   12/18/23 1438  BP: (!) 126/55  Pulse: 63  Weight: 191 lb (86.6 kg)  Height: 6' (1.829 m)    Body mass index is 25.9 kg/m.   General: The patient is well-developed and well-nourished and in no acute distress  HEENT:  Head is [place]/AT.  Sclera are anicteric.     Skin: Extremities  are without rash or  edema.   Neurologic Exam  Mental status: The patient is alert and oriented x 3 at the time of the examination. The patient has apparent normal recent and remote memory, with an apparently normal attention span and concentration ability.   Speech is normal.  Cranial nerves: Extraocular movements are full.  There is good facial sensation to soft touch bilaterally.Facial strength is normal.  Trapezius and sternocleidomastoid strength is normal. No dysarthria is noted.  The tongue is midline, and the patient has symmetric elevation of the soft palate. No obvious hearing deficits are noted.  Motor:  Muscle bulk is normal.   Tone is normal. Strength is  5 / 5 in all 4 extremities.   Sensory: Sensory testing is intact to pinprick, soft touch and vibration sensation in the arms.  He reported fairly intact pinprick sensation in the feet.  However, vibration sensation is near absent at the toes and 20-25% at the ankles and 10% at right ankle compared to 100% at the knees  Coordination: Cerebellar testing reveals good finger-nose-finger and heel-to-shin bilaterally.  Gait and station: Station is normal.   Gait is reduced stride and a 6-7 step turn . Tandem gait is wide for age. Romberg is negative.   Reflexes: Deep tendon reflexes are symmetric and normal in the arms and knees but absent at the  ankles.   Plantar responses are flexor.       ASSESSMENT AND PLAN  Numbness  Gait abnormality  Type II diabetes mellitus with nephropathy (HCC)   Although his exam was consistent with a large fiber polyneuropathy, NCV/EMG was normal making this unlikely.  Also concern of myelopathy but MRI of the cervical spine just showed typical degenerative changes for age that should not affect gait or sensation.   His exam is stable compared to his previous exam Stay active and exercise as tolerated Rtc 1 year  This visit is part of a comprehensive longitudinal care medical relationship regarding the patients primary diagnosis of gait disturbance and related concerns.   Kamarii Carton A. Godwin Lat, MD, [facility] 12/18/2023, 8:20 PM Certified in Neurology, Clinical Neurophysiology, Sleep Medicine and Neuroimaging  [facility] [address], [address] [place], [place] [address] [phone]

## 2023-12-19 ENCOUNTER — Ambulatory Visit: Payer: Medicare PPO | Attending: Cardiology

## 2023-12-19 DIAGNOSIS — I6523 Occlusion and stenosis of bilateral carotid arteries: Secondary | ICD-10-CM | POA: Diagnosis not present

## 2023-12-20 ENCOUNTER — Ambulatory Visit: Payer: Self-pay | Admitting: Nurse Practitioner

## 2023-12-23 ENCOUNTER — Telehealth: Payer: Self-pay | Admitting: Cardiology

## 2023-12-23 NOTE — Telephone Encounter (Signed)
 The patient has been notified of the result and verbalized understanding.  All questions (if any) were answered. Copy sent to PCP as well Camilo Cella, CMA 12/23/2023 2:38 PM

## 2023-12-23 NOTE — Telephone Encounter (Signed)
 Patient is returning phone call in regard to Carotid Duplex Bilateral results. Please advise.

## 2023-12-24 ENCOUNTER — Other Ambulatory Visit: Payer: Self-pay

## 2023-12-24 DIAGNOSIS — I6523 Occlusion and stenosis of bilateral carotid arteries: Secondary | ICD-10-CM

## 2024-02-19 ENCOUNTER — Encounter: Payer: Self-pay | Admitting: Podiatry

## 2024-02-19 ENCOUNTER — Ambulatory Visit: Admitting: Podiatry

## 2024-02-19 DIAGNOSIS — Z125 Encounter for screening for malignant neoplasm of prostate: Secondary | ICD-10-CM | POA: Diagnosis not present

## 2024-02-19 DIAGNOSIS — R351 Nocturia: Secondary | ICD-10-CM | POA: Diagnosis not present

## 2024-02-19 DIAGNOSIS — E1142 Type 2 diabetes mellitus with diabetic polyneuropathy: Secondary | ICD-10-CM

## 2024-02-19 DIAGNOSIS — M79674 Pain in right toe(s): Secondary | ICD-10-CM

## 2024-02-19 DIAGNOSIS — B351 Tinea unguium: Secondary | ICD-10-CM | POA: Diagnosis not present

## 2024-02-19 DIAGNOSIS — L84 Corns and callosities: Secondary | ICD-10-CM

## 2024-02-19 DIAGNOSIS — M79675 Pain in left toe(s): Secondary | ICD-10-CM

## 2024-02-19 DIAGNOSIS — B070 Plantar wart: Secondary | ICD-10-CM

## 2024-02-19 NOTE — Progress Notes (Signed)
  Subjective:  Patient ID: Marcus Carroll, male    DOB: 07/19/1946,   MRN: [id number]  Chief Complaint  Patient presents with   Debridement    Trim toenails/calluses-diabetic - 6.4    78 y.o. male presents concern of thickened elongated and painful nails that are difficult to trim. Requesting to have them trimmed today. Relates burning and tingling in their feet. Relates he has been using compound W on the wart areas.   PCP:  Atilano Deward ORN, MD   . Last A1c was 6.2  . Denies any other pedal complaints. Denies n/v/f/c.   PCP: Deward Atilano MD   Past Medical History:  Diagnosis Date   Atrial premature depolarization    Carotid artery disease (HCC)    Eczema    Essential hypertension    History of stroke    Right MCA distribution   Hyperlipidemia    Insomnia    Type 2 diabetes mellitus (HCC)    Venous insufficiency     Objective:  Physical Exam: Vascular: DP/PT pulses 2/4 bilateral. CFT <3 seconds. Absent hair growth on digits. Edema noted to bilateral lower extremities. Xerosis noted bilaterally.  Skin. No lacerations or abrasions bilateral feet. Nails 1-5 bilateral  are thickened discolored and elongated with subungual debris. Hyperkeratotic cored lesion noted to right fifth digit as well as sub third met head. Additional lesions Multiple capillary budding is noted throughout with cauliflower-like appearance and loss of skin tension lines within the lesion itself. New lesion also noted to left fourth digit plantarly that has capillary budding and cauliflower appearance.  Musculoskeletal: MMT 5/5 bilateral lower extremities in DF, PF, Inversion and Eversion. Deceased ROM in DF of ankle joint.  Neurological: Sensation intact to light touch. Protective sensation diminished bilateral.    Assessment:   1. Pain due to onychomycosis of toenails of both feet   2. Type 2 diabetes mellitus with diabetic polyneuropathy, unspecified whether long term insulin use (HCC)   3. Corns and  callosities   4. Plantar wart of right foot         Plan:  Patient was evaluated and treated and all questions answered. -Discussed and educated patient on diabetic foot care, especially with  regards to the vascular, neurological and musculoskeletal systems.  -Stressed the importance of good glycemic control and the detriment of not  controlling glucose levels in relation to the foot. -Discussed supportive shoes at all times and checking feet regularly.  -Mechanically debrided all nails 1-5 bilateral using sterile nail nipper and filed with dremel without incident  -Hyperkeratotic tissue debrided as well without incident. Two areas appears to be more of a wart this area was debrided and with capillary bleeding. Area treated with salycylic acid and band aid. Discussed aftercare.   -Answered all patient questions -Patient to return  in 3 months for at risk foot care -Patient advised to call the office if any problems or questions arise in the meantime.   Asberry Failing, DPM

## 2024-04-07 DIAGNOSIS — Z85828 Personal history of other malignant neoplasm of skin: Secondary | ICD-10-CM | POA: Diagnosis not present

## 2024-04-07 DIAGNOSIS — L309 Dermatitis, unspecified: Secondary | ICD-10-CM | POA: Diagnosis not present

## 2024-04-07 DIAGNOSIS — L570 Actinic keratosis: Secondary | ICD-10-CM | POA: Diagnosis not present

## 2024-04-14 DIAGNOSIS — E119 Type 2 diabetes mellitus without complications: Secondary | ICD-10-CM | POA: Diagnosis not present

## 2024-04-14 DIAGNOSIS — E1140 Type 2 diabetes mellitus with diabetic neuropathy, unspecified: Secondary | ICD-10-CM | POA: Diagnosis not present

## 2024-04-14 DIAGNOSIS — R739 Hyperglycemia, unspecified: Secondary | ICD-10-CM | POA: Diagnosis not present

## 2024-04-14 DIAGNOSIS — I10 Essential (primary) hypertension: Secondary | ICD-10-CM | POA: Diagnosis not present

## 2024-04-14 DIAGNOSIS — E7849 Other hyperlipidemia: Secondary | ICD-10-CM | POA: Diagnosis not present

## 2024-04-22 DIAGNOSIS — E1159 Type 2 diabetes mellitus with other circulatory complications: Secondary | ICD-10-CM | POA: Diagnosis not present

## 2024-04-22 DIAGNOSIS — Z23 Encounter for immunization: Secondary | ICD-10-CM | POA: Diagnosis not present

## 2024-04-22 DIAGNOSIS — E7849 Other hyperlipidemia: Secondary | ICD-10-CM | POA: Diagnosis not present

## 2024-04-22 DIAGNOSIS — Z6825 Body mass index (BMI) 25.0-25.9, adult: Secondary | ICD-10-CM | POA: Diagnosis not present

## 2024-04-22 DIAGNOSIS — E782 Mixed hyperlipidemia: Secondary | ICD-10-CM | POA: Diagnosis not present

## 2024-04-22 DIAGNOSIS — I10 Essential (primary) hypertension: Secondary | ICD-10-CM | POA: Diagnosis not present

## 2024-04-22 DIAGNOSIS — N400 Enlarged prostate without lower urinary tract symptoms: Secondary | ICD-10-CM | POA: Diagnosis not present

## 2024-05-20 ENCOUNTER — Ambulatory Visit: Admitting: Podiatry

## 2024-05-31 ENCOUNTER — Other Ambulatory Visit: Payer: Self-pay | Admitting: Cardiology

## 2024-06-08 DIAGNOSIS — M25521 Pain in right elbow: Secondary | ICD-10-CM | POA: Diagnosis not present

## 2024-06-08 DIAGNOSIS — M79601 Pain in right arm: Secondary | ICD-10-CM | POA: Diagnosis not present

## 2024-06-08 DIAGNOSIS — M25551 Pain in right hip: Secondary | ICD-10-CM | POA: Diagnosis not present

## 2024-06-08 DIAGNOSIS — S52021A Displaced fracture of olecranon process without intraarticular extension of right ulna, initial encounter for closed fracture: Secondary | ICD-10-CM | POA: Diagnosis not present

## 2024-06-09 ENCOUNTER — Encounter: Payer: Self-pay | Admitting: Podiatry

## 2024-06-09 ENCOUNTER — Ambulatory Visit: Admitting: Podiatry

## 2024-06-09 DIAGNOSIS — M79675 Pain in left toe(s): Secondary | ICD-10-CM | POA: Diagnosis not present

## 2024-06-09 DIAGNOSIS — E119 Type 2 diabetes mellitus without complications: Secondary | ICD-10-CM

## 2024-06-09 DIAGNOSIS — E1142 Type 2 diabetes mellitus with diabetic polyneuropathy: Secondary | ICD-10-CM

## 2024-06-09 DIAGNOSIS — B351 Tinea unguium: Secondary | ICD-10-CM | POA: Diagnosis not present

## 2024-06-09 DIAGNOSIS — Z0189 Encounter for other specified special examinations: Secondary | ICD-10-CM

## 2024-06-09 DIAGNOSIS — M79674 Pain in right toe(s): Secondary | ICD-10-CM

## 2024-06-09 NOTE — Progress Notes (Signed)
  Subjective:  Patient ID: Marcus Carroll, male    DOB: 12-20-1945,   MRN: [id number]  Chief Complaint  Patient presents with   Diabetes    I'm here for my three month check-up.  Saw Dr. Atilano, Deward - 3 weeks ago; A1c was 6.4       78 y.o. male presents concern of thickened elongated and painful nails that are difficult to trim. Requesting to have them trimmed today. Relates burning and tingling in their feet. Relates he has been using compound W on the wart areas.   PCP:  Atilano Deward ORN, MD   . Last A1c was 6.2  . Denies any other pedal complaints. Denies n/v/f/c.   PCP: Deward Atilano MD   Past Medical History:  Diagnosis Date   Atrial premature depolarization    Carotid artery disease    Eczema    Essential hypertension    History of stroke    Right MCA distribution   Hyperlipidemia    Insomnia    Type 2 diabetes mellitus (HCC)    Venous insufficiency     Objective:  Physical Exam: Vascular: DP/PT pulses 2/4 bilateral. CFT <3 seconds. Absent hair growth on digits. Edema noted to bilateral lower extremities. Xerosis noted bilaterally.  Skin. No lacerations or abrasions bilateral feet. Nails 1-5 bilateral  are thickened discolored and elongated with subungual debris. Hyperkeratotic cored lesion noted to right fifth digit as well as sub third met head. Additional lesions Multiple capillary budding is noted throughout with cauliflower-like appearance and loss of skin tension lines within the lesion itself. New lesion also noted to left fourth digit plantarly that has capillary budding and cauliflower appearance.  Musculoskeletal: MMT 5/5 bilateral lower extremities in DF, PF, Inversion and Eversion. Deceased ROM in DF of ankle joint.  Neurological: Sensation intact to light touch. Protective sensation diminished bilateral.    Assessment:   1. Pain due to onychomycosis of toenails of both feet   2. Type 2 diabetes mellitus with diabetic polyneuropathy, unspecified whether long  term insulin use (HCC)   3. Encounter for diabetic foot exam (HCC)         Plan:  Patient was evaluated and treated and all questions answered. -Discussed and educated patient on diabetic foot care, especially with  regards to the vascular, neurological and musculoskeletal systems.  -Stressed the importance of good glycemic control and the detriment of not  controlling glucose levels in relation to the foot. -Discussed supportive shoes at all times and checking feet regularly.  -Mechanically debrided all nails 1-5 bilateral using sterile nail nipper and filed with dremel without incident  -Hyperkeratotic tissue debrided as well without incident. Two areas appears to be more of a wart this area was debrided and with capillary bleeding. Area treated with salycylic acid and band aid. Discussed aftercare.   -Answered all patient questions -Patient to return  in 3 months for at risk foot care -Patient advised to call the office if any problems or questions arise in the meantime.   Marcus Carroll, DPM

## 2024-06-10 ENCOUNTER — Other Ambulatory Visit: Payer: Self-pay | Admitting: Orthopedic Surgery

## 2024-06-10 DIAGNOSIS — S52021A Displaced fracture of olecranon process without intraarticular extension of right ulna, initial encounter for closed fracture: Secondary | ICD-10-CM | POA: Diagnosis not present

## 2024-06-11 ENCOUNTER — Encounter (HOSPITAL_COMMUNITY)
Admission: RE | Disposition: A | Discharge status: Home / Self Care | Payer: Self-pay | Source: Ambulatory Visit | Attending: Orthopedic Surgery

## 2024-06-11 ENCOUNTER — Encounter (HOSPITAL_COMMUNITY): Payer: Self-pay | Admitting: Orthopedic Surgery

## 2024-06-11 ENCOUNTER — Other Ambulatory Visit: Payer: Self-pay

## 2024-06-11 ENCOUNTER — Ambulatory Visit (HOSPITAL_COMMUNITY): Admitting: Certified Registered Nurse Anesthetist

## 2024-06-11 ENCOUNTER — Ambulatory Visit (HOSPITAL_COMMUNITY)
Admission: RE | Admit: 2024-06-11 | Discharge: 2024-06-11 | Disposition: A | Discharge status: Home / Self Care | Source: Ambulatory Visit | Attending: Orthopedic Surgery | Admitting: Orthopedic Surgery

## 2024-06-11 DIAGNOSIS — I10 Essential (primary) hypertension: Secondary | ICD-10-CM | POA: Insufficient documentation

## 2024-06-11 DIAGNOSIS — Z7984 Long term (current) use of oral hypoglycemic drugs: Secondary | ICD-10-CM | POA: Insufficient documentation

## 2024-06-11 DIAGNOSIS — Z87891 Personal history of nicotine dependence: Secondary | ICD-10-CM | POA: Diagnosis not present

## 2024-06-11 DIAGNOSIS — Z955 Presence of coronary angioplasty implant and graft: Secondary | ICD-10-CM | POA: Diagnosis not present

## 2024-06-11 DIAGNOSIS — S52021A Displaced fracture of olecranon process without intraarticular extension of right ulna, initial encounter for closed fracture: Secondary | ICD-10-CM | POA: Diagnosis not present

## 2024-06-11 DIAGNOSIS — W19XXXA Unspecified fall, initial encounter: Secondary | ICD-10-CM | POA: Insufficient documentation

## 2024-06-11 DIAGNOSIS — K219 Gastro-esophageal reflux disease without esophagitis: Secondary | ICD-10-CM | POA: Insufficient documentation

## 2024-06-11 DIAGNOSIS — E119 Type 2 diabetes mellitus without complications: Secondary | ICD-10-CM | POA: Diagnosis not present

## 2024-06-11 DIAGNOSIS — G8918 Other acute postprocedural pain: Secondary | ICD-10-CM | POA: Diagnosis not present

## 2024-06-11 HISTORY — PX: ORIF ELBOW FRACTURE: SHX5031

## 2024-06-11 LAB — BASIC METABOLIC PANEL WITH GFR
Anion gap: 9 (ref 5–15)
BUN: 22 mg/dL (ref 8–23)
CO2: 27 mmol/L (ref 22–32)
Calcium: 9.5 mg/dL (ref 8.9–10.3)
Chloride: 102 mmol/L (ref 98–111)
Creatinine, Ser: 0.88 mg/dL (ref 0.61–1.24)
GFR, Estimated: >60 mL/min (ref >60)
Glucose, Bld: 133 mg/dL — ABNORMAL HIGH (ref 70–99)
Potassium: 4 mmol/L (ref 3.5–5.1)
Sodium: 138 mmol/L (ref 135–145)

## 2024-06-11 LAB — CBC WITH DIFFERENTIAL/PLATELET
Abs Immature Granulocytes: 0.04 K/uL (ref 0.00–0.07)
Basophils Absolute: 0.1 K/uL (ref 0.0–0.1)
Basophils Relative: 1 %
Eosinophils Absolute: 0.2 K/uL (ref 0.0–0.5)
Eosinophils Relative: 2 %
HCT: 39.5 % (ref 39.0–52.0)
Hemoglobin: 12.8 g/dL — ABNORMAL LOW (ref 13.0–17.0)
Immature Granulocytes: 1 %
Lymphocytes Relative: 18 %
Lymphs Abs: 1.5 K/uL (ref 0.7–4.0)
MCH: 29.5 pg (ref 26.0–34.0)
MCHC: 32.4 g/dL (ref 30.0–36.0)
MCV: 91 fL (ref 80.0–100.0)
Monocytes Absolute: 0.6 K/uL (ref 0.1–1.0)
Monocytes Relative: 7 %
Neutro Abs: 6.1 K/uL (ref 1.7–7.7)
Neutrophils Relative %: 71 %
Platelets: 255 K/uL (ref 150–400)
RBC: 4.34 MIL/uL (ref 4.22–5.81)
RDW: 13 % (ref 11.5–15.5)
WBC: 8.4 K/uL (ref 4.0–10.5)
nRBC: 0 % (ref 0.0–0.2)

## 2024-06-11 LAB — GLUCOSE, CAPILLARY
Glucose-Capillary: 144 mg/dL — ABNORMAL HIGH (ref 70–99)
Glucose-Capillary: 152 mg/dL — ABNORMAL HIGH (ref 70–99)

## 2024-06-11 SURGERY — OPEN REDUCTION INTERNAL FIXATION (ORIF) ELBOW/OLECRANON FRACTURE
Anesthesia: General | Site: Elbow | Laterality: Right

## 2024-06-11 MED ORDER — FENTANYL CITRATE (PF) 50 MCG/ML IJ SOSY
PREFILLED_SYRINGE | INTRAMUSCULAR | Status: AC
  Filled 2024-06-11: qty 1

## 2024-06-11 MED ORDER — ACETAMINOPHEN 10 MG/ML IV SOLN
1000.0000 mg | Freq: Once | INTRAVENOUS | Status: DC | PRN
Start: 2024-06-11 — End: 2024-06-11

## 2024-06-11 MED ORDER — INSULIN ASPART 100 UNIT/ML IJ SOLN: 0.0000 [IU] | INTRAMUSCULAR | Status: DC | PRN

## 2024-06-11 MED ORDER — OXYCODONE HCL 5 MG PO TABS: 5.0000 mg | ORAL_TABLET | Freq: Once | ORAL | Status: DC | PRN

## 2024-06-11 MED ORDER — FENTANYL CITRATE (PF) 100 MCG/2ML IJ SOLN
INTRAMUSCULAR | Status: AC
  Filled 2024-06-11: qty 2

## 2024-06-11 MED ORDER — BUPIVACAINE-EPINEPHRINE (PF) 0.5% -1:200000 IJ SOLN
INTRAMUSCULAR | Status: DC | PRN
  Administered 2024-06-11: 30 mL via PERINEURAL

## 2024-06-11 MED ORDER — OXYCODONE HCL 5 MG PO TABS: 5.0000 mg | ORAL_TABLET | ORAL | 0 refills | Status: AC | PRN

## 2024-06-11 MED ORDER — FENTANYL CITRATE (PF) 50 MCG/ML IJ SOSY: 25.0000 ug | PREFILLED_SYRINGE | INTRAMUSCULAR | Status: DC | PRN

## 2024-06-11 MED ORDER — LIDOCAINE 2% (20 MG/ML) 5 ML SYRINGE
INTRAMUSCULAR | Status: DC | PRN
  Administered 2024-06-11: 60 mg via INTRAVENOUS

## 2024-06-11 MED ORDER — TIZANIDINE HCL 4 MG PO TABS: 4.0000 mg | ORAL_TABLET | Freq: Three times a day (TID) | ORAL | 0 refills | Status: AC | PRN

## 2024-06-11 MED ORDER — OXYCODONE HCL 5 MG/5ML PO SOLN: 5.0000 mg | Freq: Once | ORAL | Status: DC | PRN

## 2024-06-11 MED ORDER — DEXAMETHASONE SOD PHOSPHATE PF 10 MG/ML IJ SOLN
INTRAMUSCULAR | Status: DC | PRN
  Administered 2024-06-11: 10 mg via INTRAVENOUS

## 2024-06-11 MED ORDER — CHLORHEXIDINE GLUCONATE 0.12 % MT SOLN
15.0000 mL | Freq: Once | OROMUCOSAL | Status: AC
  Administered 2024-06-11: 15 mL via OROMUCOSAL

## 2024-06-11 MED ORDER — CEFAZOLIN SODIUM-DEXTROSE 2-4 GM/100ML-% IV SOLN
2.0000 g | INTRAVENOUS | Status: AC
  Administered 2024-06-11: 2 g via INTRAVENOUS
  Filled 2024-06-11: qty 100

## 2024-06-11 MED ORDER — FENTANYL CITRATE (PF) 100 MCG/2ML IJ SOLN
INTRAMUSCULAR | Status: DC | PRN
  Administered 2024-06-11 (×2): 50 ug via INTRAVENOUS

## 2024-06-11 MED ORDER — ROCURONIUM BROMIDE 10 MG/ML (PF) SYRINGE
PREFILLED_SYRINGE | INTRAVENOUS | Status: AC
  Filled 2024-06-11: qty 10

## 2024-06-11 MED ORDER — 0.9 % SODIUM CHLORIDE (POUR BTL) OPTIME
TOPICAL | Status: DC | PRN
  Administered 2024-06-11: 1000 mL

## 2024-06-11 MED ORDER — SUGAMMADEX SODIUM 200 MG/2ML IV SOLN
INTRAVENOUS | Status: AC
  Filled 2024-06-11: qty 2

## 2024-06-11 MED ORDER — LACTATED RINGERS IV SOLN: INTRAVENOUS | Status: DC

## 2024-06-11 MED ORDER — ONDANSETRON HCL 4 MG/2ML IJ SOLN
INTRAMUSCULAR | Status: DC | PRN
  Administered 2024-06-11: 4 mg via INTRAVENOUS

## 2024-06-11 MED ORDER — PROPOFOL 10 MG/ML IV BOLUS
INTRAVENOUS | Status: DC | PRN
Start: 2024-06-11 — End: 2024-06-11
  Administered 2024-06-11: 160 mg via INTRAVENOUS

## 2024-06-11 MED ORDER — PROPOFOL 10 MG/ML IV BOLUS
INTRAVENOUS | Status: AC
  Filled 2024-06-11: qty 20

## 2024-06-11 MED ORDER — ONDANSETRON HCL 4 MG/2ML IJ SOLN: 4.0000 mg | Freq: Once | INTRAMUSCULAR | Status: DC | PRN

## 2024-06-11 SURGICAL SUPPLY — 53 items
BAG COUNTER SPONGE SURGICOUNT (BAG) IMPLANT
BIT DRILL 2.0 LNG QUCK RELEASE (BIT) IMPLANT
BIT DRILL 2.8 QUICK RELEASE (BIT) IMPLANT
BLADE SURG 15 STRL LF DISP TIS (BLADE) ×2 IMPLANT
BLADE SW THK.38XMED LNG THN (BLADE) ×1 IMPLANT
BNDG ELASTIC 4INX 5YD STR LF (GAUZE/BANDAGES/DRESSINGS) ×2 IMPLANT
BNDG ELASTIC 6INX 5YD STR LF (GAUZE/BANDAGES/DRESSINGS) IMPLANT
BNDG ESMARK 4X9 LF (GAUZE/BANDAGES/DRESSINGS) ×1 IMPLANT
BNDG GAUZE DERMACEA FLUFF 4 (GAUZE/BANDAGES/DRESSINGS) IMPLANT
COVER SURGICAL LIGHT HANDLE (MISCELLANEOUS) ×1 IMPLANT
CUFF TOURN SGL QUICK 18X4 (TOURNIQUET CUFF) IMPLANT
CUFF TRNQT CYL 24X4X16.5-23 (TOURNIQUET CUFF) IMPLANT
DRAPE C-ARM 42X120 X-RAY (DRAPES) ×1 IMPLANT
DRAPE U-SHAPE 47X51 STRL (DRAPES) ×1 IMPLANT
DURAPREP 26ML APPLICATOR (WOUND CARE) ×1 IMPLANT
ELECT REM PT RETURN 15FT ADLT (MISCELLANEOUS) ×1 IMPLANT
GAUZE PAD ABD 8X10 STRL (GAUZE/BANDAGES/DRESSINGS) IMPLANT
GAUZE SPONGE 4X4 12PLY STRL (GAUZE/BANDAGES/DRESSINGS) ×1 IMPLANT
GAUZE XEROFORM 1X8 LF (GAUZE/BANDAGES/DRESSINGS) IMPLANT
GLOVE BIO SURGEON STRL SZ7.5 (GLOVE) ×1 IMPLANT
GLOVE BIOGEL PI IND STRL 6.5 (GLOVE) ×1 IMPLANT
GLOVE BIOGEL PI IND STRL 8 (GLOVE) ×1 IMPLANT
GLOVE SURG POLYISO LF SZ6.5 (GLOVE) ×1 IMPLANT
GOWN STRL REUS W/ TWL XL LVL3 (GOWN DISPOSABLE) ×2 IMPLANT
GUIDEWIRE ORTH 6X062XTROC NS (WIRE) IMPLANT
KIT BASIN OR (CUSTOM PROCEDURE TRAY) ×1 IMPLANT
KIT TURNOVER KIT A (KITS) ×1 IMPLANT
MANIFOLD NEPTUNE II (INSTRUMENTS) ×1 IMPLANT
NDL MAYO 6 CRC TAPER PT (NEEDLE) ×1 IMPLANT
NEEDLE MAYO 6 CRC TAPER PT (NEEDLE) ×1 IMPLANT
PACK ORTHO EXTREMITY (CUSTOM PROCEDURE TRAY) ×1 IMPLANT
PAD CAST 4YDX4 CTTN HI CHSV (CAST SUPPLIES) ×1 IMPLANT
PENCIL SMOKE EVACUATOR (MISCELLANEOUS) IMPLANT
PLATE OLECRANON 5 HOLE (Plate) IMPLANT
PROTECTOR NERVE ULNAR (MISCELLANEOUS) ×1 IMPLANT
SCREW HEX LOCK 2.7X16MM (Screw) IMPLANT
SCREW HEX LOCK 3.5X20MM (Screw) IMPLANT
SCREW HEX NON LOCK 3.5X55MM (Screw) IMPLANT
SCREW HEXALOBE LOCK 3.5X22MM (Screw) IMPLANT
SCREW LOCK 18X2.7X HEXALOBE (Screw) IMPLANT
SCREW NON LOCKING HEX 3.5X22 (Screw) IMPLANT
SLING ARM IMMOBILIZER LRG (SOFTGOODS) IMPLANT
SPONGE T-LAP 4X18 ~~LOC~~+RFID (SPONGE) ×1 IMPLANT
STOCKINETTE 8 INCH (MISCELLANEOUS) ×1 IMPLANT
STRIP CLOSURE SKIN 1/2X4 (GAUZE/BANDAGES/DRESSINGS) ×1 IMPLANT
SUCTION TUBE FRAZIER 10FR DISP (SUCTIONS) ×1 IMPLANT
SUT MNCRL AB 4-0 PS2 18 (SUTURE) ×1 IMPLANT
SUT VIC AB 0 CT1 36 (SUTURE) ×1 IMPLANT
SUT VIC AB 1 CT1 27XBRD ANTBC (SUTURE) ×1 IMPLANT
SUT VIC AB 2-0 CT1 TAPERPNT 27 (SUTURE) ×1 IMPLANT
SUTURE FIBERWR #2 38 T-5 BLUE (SUTURE) ×1 IMPLANT
TOWEL OR 17X26 10 PK STRL BLUE (TOWEL DISPOSABLE) ×2 IMPLANT
UNDERPAD 30X36 HEAVY ABSORB (UNDERPADS AND DIAPERS) ×1 IMPLANT

## 2024-06-11 NOTE — Op Note (Addendum)
 Procedure(s): OPEN REDUCTION INTERNAL FIXATION (ORIF) ELBOW/OLECRANON FRACTURE Procedure Note  Marcus Carroll male 78 y.o. 06/11/2024  Preoperative diagnosis: Right elbow displaced olecranon fracture  Postoperative diagnosis: Same  Procedure(s) and Anesthesia Type:    * OPEN REDUCTION INTERNAL FIXATION (ORIF) ELBOW/OLECRANON FRACTURE - General  Surgeons and Role:    DEWAINE Dozier Soulier, MD - Primary   Indications:  78 y.o. male s/p fall with right displaced olecranon fracture.  Indicated for surgical fixation to restore joint congruity and allow early motion.     Surgeon: Josefa LELON Dozier   Assistants: Jeoffrey Northern PA-C Amber was present and scrubbed throughout the procedure and was essential in positioning, retraction, exposure, and closure)  Anesthesia: General endotracheal anesthesia with preoperative interscalene block given by the attending anesthesiologist     Procedure Detail  OPEN REDUCTION INTERNAL FIXATION (ORIF) ELBOW/OLECRANON FRACTURE  Findings: Anatomic reduction with Acumed locking plate  Estimated Blood Loss:  less than 50 mL         Drains: none  Blood Given: none         Specimens: none        Complications:  * No complications entered in OR log *         Disposition: PACU - hemodynamically stable.         Condition: stable    Procedure:  The patient was identified in the preoperative  holding area where I personally marked the operative site after  verifying site side and procedure with the patient. The patient was taken back  to the operating room where general anesthesia was induced without  Complication. The patient was placed in supine position with a padded bump on the chest.  A nonsterile tourniquet was applied to the right upper arm.  The right upper extremity was then prepped and draped in standard sterile fashion.  The appropriate timeout procedure was carried out.  The limb was exsanguinated with an Esmarch dressing and the  tourniquet was elevated to 250 mmHg.  The arm was placed across the chest and a approximately 10 cm incision was made over the posterior aspect of the proximal ulna.  Dissection was carried down to the bone.  The fracture was identified and cleaned of hematoma.  A fracture reduction clamp was used to hold the reduction.  A plate was applied provisionally with K wires and x-ray was used to verify fracture reduction and plate position.  Proximal locking screws were then applied.  The distal screw was placed in a compression mode to achieve compression of the fracture.  Additional locking screws were placed distally.  1 proximal to distal screw was placed through the posterior hole in the plate exiting the anterior ulna distal to the joint.  Final fluoroscopic imaging demonstrated anatomic reduction of fracture with appropriate position of plate and screws.  Elbow range of motion was full and unimpeded.  Copious irrigation was used.  The wound was then closed in layers with 2-0 Vicryl closing what ever fascia could be closed over the plate followed by 2-0 Vicryl and staples for skin closure.  The patient was placed in a bulky sterile dressing from the hand to the upper arm including an Ace bandage.  Patient was then allowed to awaken from anesthesia and placed in a sling transferred to the stretcher and taken to the recovery room in stable condition.

## 2024-06-11 NOTE — Anesthesia Procedure Notes (Addendum)
 Anesthesia Regional Block: Supraclavicular block   Pre-Anesthetic Checklist: , timeout performed,  Correct Patient, Correct Site, Correct Laterality,  Correct Procedure, Correct Position, site marked,  Risks and benefits discussed,  Surgical consent,  Pre-op evaluation,  At surgeon's request and post-op pain management  Laterality: Right  Prep: Maximum Sterile Barrier Precautions used, chloraprep       Needles:  Injection technique: Single-shot  Needle Type: Echogenic Needle      Needle Gauge: 20     Additional Needles:   Procedures:,,,, ultrasound used (permanent image in chart),,    Narrative:  Start time: 06/11/2024 11:25 AM End time: 06/11/2024 11:28 AM Injection made incrementally with aspirations every 5 mL.  Performed by: Personally  Anesthesiologist: Keneth Lynwood POUR, MD

## 2024-06-11 NOTE — Progress Notes (Addendum)
 Left message for patient this morning to arrive at 0900 for a 1130 surgery for Dr Dozier today.     Patient returned call and is aware to remain NPO and arrive at 0900 am

## 2024-06-11 NOTE — Anesthesia Preprocedure Evaluation (Signed)
 Anesthesia Evaluation  Patient identified by MRN, date of birth, ID band Patient awake    Reviewed: Allergy & Precautions, NPO status , Patient's Chart, lab work & pertinent test results, reviewed documented beta blocker date and time   History of Anesthesia Complications Negative for: history of anesthetic complications  Airway Mallampati: III  TM Distance: >3 FB     Dental no notable dental hx.    Pulmonary neg COPD, former smoker   breath sounds clear to auscultation       Cardiovascular hypertension, (-) CAD, (-) Past MI and (-) Cardiac Stents  Rhythm:Regular Rate:Normal     Neuro/Psych neg Seizures Summary:  Right Carotid: Velocities in the right ICA are consistent with a 40-59%                 stenosis. Non-hemodynamically significant plaque <50% noted  in                the CCA. The ECA appears <50% stenosed.   Left Carotid: Velocities in the left ICA are consistent with a 40-59%  stenosis.               Non-hemodynamically significant plaque <50% noted in the  CCA. The                ECA appears <50% stenosed.    Neuromuscular disease    GI/Hepatic ,GERD  Medicated and Controlled,,(+) neg Cirrhosis        Endo/Other  diabetes, Type 2    Renal/GU Renal disease     Musculoskeletal   Abdominal   Peds  Hematology   Anesthesia Other Findings   Reproductive/Obstetrics                              Anesthesia Physical Anesthesia Plan  ASA: 2  Anesthesia Plan: General   Post-op Pain Management: Regional block*   Induction: Intravenous  PONV Risk Score and Plan: 2 and Ondansetron and Dexamethasone  Airway Management Planned: Oral ETT  Additional Equipment:   Intra-op Plan:   Post-operative Plan: Extubation in OR  Informed Consent: I have reviewed the patients History and Physical, chart, labs and discussed the procedure including the risks, benefits and alternatives  for the proposed anesthesia with the patient or authorized representative who has indicated his/her understanding and acceptance.     Dental advisory given  Plan Discussed with: CRNA  Anesthesia Plan Comments:          Anesthesia Quick Evaluation

## 2024-06-11 NOTE — Discharge Instructions (Addendum)
 Discharge Instructions after Open Elbow Surgery   A sling and splint may be provided for you. If so, remain in your splint and sling at all times.  Use ice on the elbow intermittently over the first 48 hours after surgery. Pain medicine has been prescribed for you.  Use your medicine liberally over the first 48 hours, and then you can begin to taper your use. You may take Extra Strength Tylenol or Tylenol only in place of the pain pills.  Leave the dressing in place until your follow-up appointment  You may shower after surgery. The dressing CANNOT get wet during your shower. Wrap the dressing with a dry towel and place your arm in a large trash bag.  Take one 81mg  aspirin a day for 2 weeks after surgery, unless you have an aspirin sensitivity/ allergy or asthma.     Please call [phone] during normal business hours or [phone] after hours for any problems. Including the following:  - excessive redness of the incisions - drainage for more than 4 days - fever of more than 101.5 F  *Please note that pain medications will not be refilled after hours or on weekends.

## 2024-06-11 NOTE — Transfer of Care (Signed)
 Immediate Anesthesia Transfer of Care Note  Patient: Marcus Carroll  Procedure(s) Performed: Procedure(s): OPEN REDUCTION INTERNAL FIXATION (ORIF) ELBOW/OLECRANON FRACTURE (Right)  Patient Location: PACU  Anesthesia Type:General  Level of Consciousness: Alert, Awake, Oriented  Airway & Oxygen Therapy: Patient Spontanous Breathing  Post-op Assessment: Report given to RN  Post vital signs: Reviewed and stable  Last Vitals:  Vitals:   06/11/24 1008 06/11/24 1257  BP: 136/66 128/61  Pulse: 72 64  Resp: 16 20  Temp: 36.7 C   SpO2: 98% 100%    Complications: No apparent anesthesia complications

## 2024-06-11 NOTE — H&P (Signed)
 Marcus Carroll is an 78 y.o. male.   Chief Complaint: R elbow injury  HPI: s/p fall with R elbow displaced olecranon fracture.  Past Medical History:  Diagnosis Date   Atrial premature depolarization    Carotid artery disease    Eczema    Essential hypertension    History of stroke    Right MCA distribution   Hyperlipidemia    Insomnia    Type 2 diabetes mellitus (HCC)    Venous insufficiency     Past Surgical History:  Procedure Laterality Date   GALLBLADDER SURGERY  1985    Family History  Problem Relation Age of Onset   Heart murmur Father    Heart attack Maternal Grandfather    Neuropathy Neg Hx    Social History:  reports that he has quit smoking. His smoking use included cigarettes and cigars. He has never been exposed to tobacco smoke. He has never used smokeless tobacco. He reports current alcohol use. He reports that he does not use drugs.  Allergies: No Known Allergies  Medications Prior to Admission  Medication Sig Dispense Refill   ALPHA LIPOIC ACID PO Take by mouth.     aspirin EC 81 MG tablet Take 81 mg by mouth daily.     atorvastatin (LIPITOR) 40 MG tablet Take 40 mg by mouth daily.     betamethasone  dipropionate 0.05 % cream Apply 1 Application topically as needed.     chlorthalidone  (HYGROTON ) 25 MG tablet TAKE 1 TABLET (25 MG TOTAL) BY MOUTH DAILY. 90 tablet 1   Coenzyme Q10 (CO Q 10 PO) Take 1 tablet by mouth daily.     Cyanocobalamin  (B-12 PO) Take 1 tablet by mouth daily.     dupilumab  (DUPIXENT ) 300 MG/2ML prefilled syringe INJECT 300MG  (1 SYRINGE) SUBCUTANEOUSLY EVERY 2 WEEKS AS DIRECTED. 4 mL 6   finasteride (PROSCAR) 5 MG tablet Take 5 mg by mouth daily.     lisinopril  (ZESTRIL ) 10 MG tablet TAKE 1 TABLET BY MOUTH EVERY DAY 90 tablet 2   metFORMIN (GLUCOPHAGE-XR) 500 MG 24 hr tablet Take 500 mg by mouth daily with breakfast.     omeprazole (PRILOSEC) 20 MG capsule Take 1 capsule by mouth daily.     potassium chloride  SA (KLOR-CON  M) 20 MEQ  tablet TAKE 1 TABLET BY MOUTH EVERY DAY 90 tablet 1   tamsulosin (FLOMAX) 0.4 MG CAPS capsule Take 0.4 mg by mouth daily.      Results for orders placed or performed during the hospital encounter of 06/11/24 (from the past 48 hours)  Glucose, capillary     Status: Abnormal   Collection Time: 06/11/24 10:11 AM  Result Value Ref Range   Glucose-Capillary 144 (H) 70 - 99 mg/dL    Comment: Glucose reference range applies only to samples taken after fasting for at least 8 hours.   Comment 1 Notify RN    No results found.  Review of Systems  All other systems reviewed and are negative.   Blood pressure 136/66, pulse 72, temperature 98 F (36.7 C), temperature source Oral, resp. rate 16, height 6' (1.829 m), weight 85.3 kg, SpO2 98%. Physical Exam Constitutional:      Appearance: He is well-developed.  HENT:     Head: Atraumatic.  Eyes:     Extraocular Movements: Extraocular movements intact.  Cardiovascular:     Pulses: Normal pulses.  Pulmonary:     Effort: Pulmonary effort is normal.  Musculoskeletal:     Comments: RUE splinted. NVID.  Skin:    General: Skin is warm and dry.  Neurological:     Mental Status: He is alert and oriented to person, place, and time.  Psychiatric:        Mood and Affect: Mood normal.      Assessment/Plan s/p fall with R elbow displaced olecranon fracture. Plan ORIF R olecranon fx. Risks / benefits of surgery discussed Consent on chart  NPO for OR Preop antibiotics   Josefa LELON Herring, MD 06/11/2024, 10:28 AM

## 2024-06-11 NOTE — Anesthesia Procedure Notes (Signed)
 Procedure Name: LMA Insertion Date/Time: 06/11/2024 11:34 AM  Performed by: Mitchell Celestine BRAVO, CRNAPre-anesthesia Checklist: Patient identified, Patient being monitored, Timeout performed, Emergency Drugs available and Suction available Patient Re-evaluated:Patient Re-evaluated prior to induction Oxygen Delivery Method: Circle system utilized Preoxygenation: Pre-oxygenation with 100% oxygen Induction Type: IV induction Ventilation: Mask ventilation without difficulty LMA: LMA inserted LMA Size: 4.0 Tube type: Oral Number of attempts: 1 Placement Confirmation: positive ETCO2 and breath sounds checked- equal and bilateral Tube secured with: Tape Dental Injury: Teeth and Oropharynx as per pre-operative assessment

## 2024-06-12 NOTE — Anesthesia Postprocedure Evaluation (Signed)
 Anesthesia Post Note  Patient: Marcus Carroll  Procedure(s) Performed: OPEN REDUCTION INTERNAL FIXATION (ORIF) ELBOW/OLECRANON FRACTURE (Right: Elbow)     Patient location during evaluation: PACU Anesthesia Type: General Level of consciousness: awake and alert Pain management: pain level controlled Vital Signs Assessment: post-procedure vital signs reviewed and stable Respiratory status: spontaneous breathing, nonlabored ventilation, respiratory function stable and patient connected to nasal cannula oxygen Cardiovascular status: blood pressure returned to baseline and stable Postop Assessment: no apparent nausea or vomiting Anesthetic complications: no   No notable events documented.  Last Vitals:  Vitals:   06/11/24 1345 06/11/24 1357  BP: (!) 114/55 (!) 139/59  Pulse: 67 69  Resp: 16 18  Temp:  36.5 C  SpO2: 95% 99%    Last Pain:  Vitals:   06/11/24 1357  TempSrc: Oral  PainSc: 0-No pain                 Marcus Carroll

## 2024-06-17 ENCOUNTER — Encounter (HOSPITAL_COMMUNITY): Payer: Self-pay | Admitting: Orthopedic Surgery

## 2024-06-26 DIAGNOSIS — M25521 Pain in right elbow: Secondary | ICD-10-CM | POA: Diagnosis not present

## 2024-06-26 DIAGNOSIS — Z9889 Other specified postprocedural states: Secondary | ICD-10-CM | POA: Diagnosis not present

## 2024-07-08 DIAGNOSIS — Z9889 Other specified postprocedural states: Secondary | ICD-10-CM | POA: Diagnosis not present

## 2024-07-14 ENCOUNTER — Encounter: Payer: Self-pay | Admitting: Cardiology

## 2024-07-14 ENCOUNTER — Ambulatory Visit: Attending: Cardiology | Admitting: Cardiology

## 2024-07-14 ENCOUNTER — Ambulatory Visit: Admitting: Cardiology

## 2024-07-14 VITALS — BP 114/60 | HR 62 | Ht 72.0 in | Wt 188.6 lb

## 2024-07-14 DIAGNOSIS — I10 Essential (primary) hypertension: Secondary | ICD-10-CM

## 2024-07-14 DIAGNOSIS — I6523 Occlusion and stenosis of bilateral carotid arteries: Secondary | ICD-10-CM

## 2024-07-14 DIAGNOSIS — E782 Mixed hyperlipidemia: Secondary | ICD-10-CM

## 2024-07-14 NOTE — Progress Notes (Signed)
    Cardiology Office Note  Date: 07/14/2024   ID: Marcus Carroll, DOB Sep 02, 1945, MRN [id number]  History of Present Illness: Marcus Carroll is a 78 y.o. male last seen in October 2024.  He is here for a follow-up visit.  Reports no exertional chest pain or palpitations, no sudden dizziness or syncope.  No orthopnea or PND.  We reviewed his medications which are overall stable in terms of antihypertensive and lipid-lowering therapy.  Blood pressure is normal today.  He did have follow-up carotid Dopplers in May which are noted below.  Continues to follow lab work at [facility], now seeing Dr. Trudy.  I reviewed his ECG today which shows sinus rhythm with atrial bigeminy.  Physical Exam: VS:  BP 114/60 (BP Location: Left Arm, Patient Position: Sitting, Cuff Size: Normal)   Pulse 62   Ht 6' (1.829 m)   Wt 188 lb 9.6 oz (85.5 kg)   SpO2 98%   BMI 25.58 kg/m , BMI Body mass index is 25.58 kg/m.  Wt Readings from Last 3 Encounters:  07/14/24 188 lb 9.6 oz (85.5 kg)  06/11/24 188 lb (85.3 kg)  12/18/23 191 lb (86.6 kg)    General: Patient appears comfortable at rest. HEENT: Conjunctiva and lids normal. Neck: Supple, no elevated JVP or carotid bruits. Lungs: Clear to auscultation, nonlabored breathing at rest. Cardiac: Regular rate and rhythm with ectopy, no S3 or significant systolic murmur.  ECG:  An ECG dated 06/03/2023 was personally reviewed today and demonstrated:  Sinus rhythm with PACs.  Labwork: March 2023: Cholesterol 134, triglycerides 75, HDL 47, LDL 72 06/11/2024: BUN 22; Creatinine, Ser 0.88; Hemoglobin 12.8; Platelets 255; Potassium 4.0; Sodium 138   Other Studies Reviewed Today:  Rotted Dopplers 12/19/2023: Summary:  Right Carotid: Velocities in the right ICA are consistent with a 40-59%                 stenosis. Non-hemodynamically significant plaque <50% noted  in                the CCA. The ECA appears <50% stenosed.   Left Carotid: Velocities in the left  ICA are consistent with a 40-59%  stenosis.               Non-hemodynamically significant plaque <50% noted in the  CCA. The                ECA appears <50% stenosed.   Vertebrals:  Bilateral vertebral arteries demonstrate antegrade flow.  Subclavians: Normal flow hemodynamics were seen in bilateral subclavian               arteries.   Assessment and Plan:  1.  Moderate bilateral ICA stenosis, asymptomatic.  Continue aspirin and statin therapy.  Follow-up carotid Dopplers next spring.   2.  Mixed hyperlipidemia, last LDL 72 per lab work with PCP.  He remains on Lipitor 40 mg daily.   3.  Primary hypertension.  Blood pressure is normal today.  Keep follow-up with PCP.  He is currently on lisinopril  10 mg daily and chlorthalidone  25 mg daily with potassium supplement.  Renal function normal.   4.  Premature atrial complexes, asymptomatic.  Disposition:  Follow up 1 year.  Signed, Jayson JUDITHANN Sierras, M.D., F.A.C.C. [facility]

## 2024-07-14 NOTE — Patient Instructions (Signed)
 Medication Instructions:  Your physician recommends that you continue on your current medications as directed. Please refer to the Current Medication list given to you today.  Labwork: none  Testing/Procedures: Your physician has requested that you have a carotid duplex in May 2026. This test is an ultrasound of the carotid arteries in your neck. It looks at blood flow through these arteries that supply the brain with blood. Allow one hour for this exam. There are no restrictions or special instructions.  Follow-Up: Your physician recommends that you schedule a follow-up appointment in: 1 year. You will receive a reminder call in about 8-10 months reminding you to schedule your appointment. If you don't receive this call, please contact our office.  Any Other Special Instructions Will Be Listed Below (If Applicable).  If you need a refill on your cardiac medications before your next appointment, please call your pharmacy.

## 2024-08-24 ENCOUNTER — Encounter: Payer: Self-pay | Admitting: Neurology

## 2024-08-24 ENCOUNTER — Ambulatory Visit: Payer: Medicare PPO | Admitting: Neurology

## 2024-08-24 VITALS — BP 136/69 | HR 66 | Ht 73.0 in | Wt 194.5 lb

## 2024-08-24 DIAGNOSIS — G629 Polyneuropathy, unspecified: Secondary | ICD-10-CM | POA: Diagnosis not present

## 2024-08-24 DIAGNOSIS — E1121 Type 2 diabetes mellitus with diabetic nephropathy: Secondary | ICD-10-CM

## 2024-08-24 DIAGNOSIS — N319 Neuromuscular dysfunction of bladder, unspecified: Secondary | ICD-10-CM

## 2024-08-24 DIAGNOSIS — R200 Anesthesia of skin: Secondary | ICD-10-CM

## 2024-08-24 DIAGNOSIS — Z7984 Long term (current) use of oral hypoglycemic drugs: Secondary | ICD-10-CM | POA: Diagnosis not present

## 2024-08-24 DIAGNOSIS — R269 Unspecified abnormalities of gait and mobility: Secondary | ICD-10-CM

## 2024-08-24 NOTE — Progress Notes (Signed)
 "  [facility]  PATIENT: Marcus Carroll DOB: 06/03/1946  REFERRING DOCTOR OR PCP: Deward Soja, MD SOURCE: Patient, notes from primary care, laboratory reports  _________________________________   HISTORICAL  CHIEF COMPLAINT:  Chief Complaint  Patient presents with   Follow-up    Room 10 Alone Polyneuropathy- patient has been well     HISTORY OF PRESENT ILLNESS:  Marcus Carroll is a 79 y.o. an with neuropathy.   Update 08/24/2024 He feels his balance is stable compared to last year.  Only one fall due to tripping over a dog.   Bladder function is the same as a few years ago - gets up 3 times most nights.    He continues to note numbness but no pain.  He notes numbness in his feet/toes but not much at ankles.  He has some numbness in finger tips. SABRA    He reports LBP is doing better.     LBP is axial into the hips but not below mid thigh.   He is stiff when he stands up, especially if sitting a while.    He denies neck pain now but had a stiff neck in the past.      He has urinary urgency and has nocturia every 2 hours - night and day   This is stable compared to last year.  No incontinence.   He also has BPH and is on terazosin with some benefit.    In 2025 he has had NCV/EMG NOT showing polyneuropathy.    MRI cervical spine showed DJD but no spinal cord change or significant spinal stenosis  In 2024, we checked, Copper , SSA/SSB, TSH, B12, SPEP/IEF, anti Hu/Ri/Yo and these were all normal or nocontribuatary.      IMAGING MRI cervical spine 09/16/2023 without contrast demonstrating: - At C3-4 disc bulging and uncovertebral joint hypertrophy with moderate right foraminal stenosis and borderline mild spinal stenosis. - At C6-7 minimal disc bulging and facet hypertrophy with borderline mild spinal stenosis and no foraminal narrowing. - No cord signal abnormalities.  Laboratory:  03/19/2022: Lipid panel was normal. 01/30/2022 CBC showed minimally elevated white  blood cell count.  BUN was minimally elevated.  Creatinine was borderline at 1.15 magnesium was normal.  Cervical Xray 09/10/2014 showed Bulky and flowing mid and lower cervical spine anterior endplate osteophytes. Relatively preserved disc spaces.   REVIEW OF SYSTEMS: Constitutional: No fevers, chills, sweats, or change in appetite Eyes: No visual changes, double vision, eye pain Ear, nose and throat: No hearing loss, ear pain, nasal congestion, sore throat Cardiovascular: No chest pain, palpitations Respiratory:  No shortness of breath at rest or with exertion.   No wheezes GastrointestinaI: No nausea, vomiting, diarrhea, abdominal pain, fecal incontinence Genitourinary:  No dysuria, urinary retention or frequency.  No nocturia. Musculoskeletal:  No neck pain, back pain Integumentary: No rash, pruritus, skin lesions Neurological: as above Psychiatric: No depression at this time.  No anxiety Endocrine: No palpitations, diaphoresis, change in appetite, change in weigh or increased thirst Hematologic/Lymphatic:  No anemia, purpura, petechiae. Allergic/Immunologic: No itchy/runny eyes, nasal congestion, recent allergic reactions, rashes  ALLERGIES: No Known Allergies  HOME MEDICATIONS:  Current Outpatient Medications:    ALPHA LIPOIC ACID PO, Take by mouth., Disp: , Rfl:    aspirin EC 81 MG tablet, Take 81 mg by mouth daily., Disp: , Rfl:    atorvastatin (LIPITOR) 40 MG tablet, Take 40 mg by mouth daily., Disp: , Rfl:    betamethasone  dipropionate 0.05 % cream, Apply  1 Application topically as needed., Disp: , Rfl:    chlorthalidone  (HYGROTON ) 25 MG tablet, TAKE 1 TABLET (25 MG TOTAL) BY MOUTH DAILY., Disp: 90 tablet, Rfl: 1   Coenzyme Q10 (CO Q 10 PO), Take 1 tablet by mouth daily., Disp: , Rfl:    Cyanocobalamin  (B-12 PO), Take 1 tablet by mouth daily., Disp: , Rfl:    dupilumab  (DUPIXENT ) 300 MG/2ML prefilled syringe, INJECT 300MG  (1 SYRINGE) SUBCUTANEOUSLY EVERY 2 WEEKS AS DIRECTED.,  Disp: 4 mL, Rfl: 6   finasteride (PROSCAR) 5 MG tablet, Take 5 mg by mouth daily., Disp: , Rfl:    lisinopril  (ZESTRIL ) 10 MG tablet, TAKE 1 TABLET BY MOUTH EVERY DAY, Disp: 90 tablet, Rfl: 2   metFORMIN (GLUCOPHAGE-XR) 500 MG 24 hr tablet, Take 500 mg by mouth daily with breakfast., Disp: , Rfl:    omeprazole (PRILOSEC) 20 MG capsule, Take 1 capsule by mouth daily., Disp: , Rfl:    oxyCODONE  (ROXICODONE ) 5 MG immediate release tablet, Take 1 tablet (5 mg total) by mouth every 4 (four) hours as needed., Disp: 30 tablet, Rfl: 0   potassium chloride  SA (KLOR-CON  M) 20 MEQ tablet, TAKE 1 TABLET BY MOUTH EVERY DAY, Disp: 90 tablet, Rfl: 1   tamsulosin (FLOMAX) 0.4 MG CAPS capsule, Take 0.4 mg by mouth daily., Disp: , Rfl:    tiZANidine  (ZANAFLEX ) 4 MG tablet, Take 1 tablet (4 mg total) by mouth every 8 (eight) hours as needed., Disp: 30 tablet, Rfl: 0  PAST MEDICAL HISTORY: Past Medical History:  Diagnosis Date   Atrial premature depolarization    Carotid artery disease    Eczema    Essential hypertension    History of stroke    Right MCA distribution   Hyperlipidemia    Insomnia    Type 2 diabetes mellitus (HCC)    Venous insufficiency     PAST SURGICAL HISTORY: Past Surgical History:  Procedure Laterality Date   GALLBLADDER SURGERY  1985   ORIF ELBOW FRACTURE Right 06/11/2024   Procedure: OPEN REDUCTION INTERNAL FIXATION (ORIF) ELBOW/OLECRANON FRACTURE;  Surgeon: Dozier Soulier, MD;  Location: [facility];  Service: Orthopedics;  Laterality: Right;    FAMILY HISTORY: Family History  Problem Relation Age of Onset   Heart murmur Father    Heart attack Maternal Grandfather    Neuropathy Neg Hx     SOCIAL HISTORY: Social History   Socioeconomic History   Marital status: Married    Spouse name: Not on file   Number of children: 5   Years of education: 18   Highest education level: Not on file  Occupational History   Not on file  Tobacco Use   Smoking status: Former     Types: Cigarettes, Cigars    Passive exposure: Never   Smokeless tobacco: Never  Vaping Use   Vaping status: Never Used  Substance and Sexual Activity   Alcohol use: Yes    Comment: 1-2 drinks per week (beers), Vodka tonic once weekly or less   Drug use: Never   Sexual activity: Not on file  Other Topics Concern   Not on file  Social History Narrative   Left handed   Caffeine use: Drinks several cups coffee daily   Semi retired    lives with wife    Social Drivers of Health   Tobacco Use: Medium Risk (08/24/2024)   Patient History    Smoking Tobacco Use: Former    Smokeless Tobacco Use: Never    Passive Exposure: Never  Financial Resource Strain: Not on file  Food Insecurity: Not on file  Transportation Needs: Not on file  Physical Activity: Not on file  Stress: Not on file  Social Connections: Not on file  Intimate Partner Violence: Not on file  Depression (PHQ2-9): Not on file  Alcohol Screen: Not on file  Housing: Not on file  Utilities: Not on file  Health Literacy: Not on file       PHYSICAL EXAM  Vitals:   08/24/24 0946  BP: 136/69  Pulse: 66  SpO2: 98%  Weight: 194 lb 8 oz (88.2 kg)  Height: 6' 1 (1.854 m)    Body mass index is 25.66 kg/m.   General: The patient is well-developed and well-nourished and in no acute distress  HEENT:  Head is [place]/AT.  Sclera are anicteric.     Skin: Extremities are without rash or  edema.   Neurologic Exam  Mental status: The patient is alert and oriented x 3 at the time of the examination. The patient has apparent normal recent and remote memory, with an apparently normal attention span and concentration ability.   Speech is normal.  Cranial nerves: Extraocular movements are full.  There is good facial sensation to soft touch bilaterally.Facial strength is normal.  Trapezius and sternocleidomastoid strength is normal. No dysarthria is noted.  The tongue is midline, and the patient has symmetric elevation of the  soft palate. No obvious hearing deficits are noted.  Motor:  Muscle bulk is normal.   Tone is normal. Strength is  5 / 5 in all 4 extremities.   Sensory: Sensory testing is intact to pinprick, soft touch and vibration sensation in the arms.  He reported fairly intact pinprick sensation in the feet.  However, vibration sensation is absent at the toes and 20% at the left ankles and 10% at right ankle compared to 100% at the knees  Coordination: Cerebellar testing reveals good finger-nose-finger and heel-to-shin bilaterally.  Gait and station: Station is normal.   Gait is arthritic and he has a reduced stride and a 5 step turn  (better). Tandem gait is poor. . Romberg is negative.   Reflexes: Deep tendon reflexes are symmetric and normal in the arms and knees but absent at the ankles.   Plantar responses are flexor.       ASSESSMENT AND PLAN  Polyneuropathy  Gait abnormality  Type II diabetes mellitus with nephropathy (HCC)  Numbness  Bladder dysfunction   For the most part, he is stable.  His exam is most consistent with a large fiber polyneuropathy but NCV/EMG was not consistent with this.  Symptoms are not consistent with small fiber polyneuropathy.  He does have diabetes mellitus.  Lab work for other treatable causes of neuropathy including B12, copper , SPEP/IEF were normal or unremarkable.  There was no evidence of myelopathy on cervical spine MRI.  I discussed with him that we do not have a great explanation for his symptoms.   Stay active and exercise as tolerated Rtc 1 year.  Call sooner for new or worsening neurologic symptoms.  This visit is part of a comprehensive longitudinal care medical relationship regarding the patients primary diagnosis of gait disturbance and related concerns.   Leonna Schlee A. Vear, MD, [facility] 08/24/2024, 10:21 AM Certified in Neurology, Clinical Neurophysiology, Sleep Medicine and Neuroimaging  [facility] [address], [address] [place], [place] [address] [phone] "

## 2024-09-09 ENCOUNTER — Encounter: Payer: Self-pay | Admitting: Podiatry

## 2024-09-09 ENCOUNTER — Ambulatory Visit: Admitting: Podiatry

## 2024-09-09 DIAGNOSIS — B070 Plantar wart: Secondary | ICD-10-CM

## 2024-09-09 DIAGNOSIS — E1142 Type 2 diabetes mellitus with diabetic polyneuropathy: Secondary | ICD-10-CM

## 2024-09-09 DIAGNOSIS — B351 Tinea unguium: Secondary | ICD-10-CM

## 2024-09-09 NOTE — Progress Notes (Addendum)
"  °  Subjective:  Patient ID: Marcus Carroll, male    DOB: [birth year],   MRN: [id number]  Chief Complaint  Patient presents with   Diabetes    //I'm here for my four month check up.  Saw Dr. Atilano - 04/22/2024; A1c - ?    79 y.o. male presents concern of thickened elongated and painful nails that are difficult to trim. Requesting to have them trimmed today. Relates burning and tingling in their feet. Relates he has been using compound W on the wart areas.   PCP:  Trudy Vaughn FALCON, MD   . Last A1c was 6.2  . Denies any other pedal complaints. Denies n/v/f/c.   PCP: Deward Atilano MD   Past Medical History:  Diagnosis Date   Atrial premature depolarization    Carotid artery disease    Eczema    Essential hypertension    History of stroke    Right MCA distribution   Hyperlipidemia    Insomnia    Type 2 diabetes mellitus (HCC)    Venous insufficiency     Objective:  Physical Exam: Vascular: DP/PT pulses 2/4 bilateral. CFT <3 seconds. Absent hair growth on digits. Edema noted to bilateral lower extremities. Xerosis noted bilaterally.  Skin. No lacerations or abrasions bilateral feet. Nails 1-5 bilateral  are thickened discolored and elongated with subungual debris. Hyperkeratotic cored lesion noted to right fifth digit as well as sub third met head. Additional lesions Multiple capillary budding is noted throughout with cauliflower-like appearance and loss of skin tension lines within the lesion itself. New lesion also noted to left fourth digit plantarly that has capillary budding and cauliflower appearance.  Musculoskeletal: MMT 5/5 bilateral lower extremities in DF, PF, Inversion and Eversion. Deceased ROM in DF of ankle joint.  Neurological: Sensation intact to light touch. Protective sensation diminished bilateral.    Assessment:   1. Pain due to onychomycosis of toenails of both feet   2. Type 2 diabetes mellitus with diabetic polyneuropathy, unspecified whether long term  insulin  use (HCC)   3. Plantar wart of right foot         Plan:  Patient was evaluated and treated and all questions answered. -Discussed and educated patient on diabetic foot care, especially with  regards to the vascular, neurological and musculoskeletal systems.  -Stressed the importance of good glycemic control and the detriment of not  controlling glucose levels in relation to the foot. -Discussed supportive shoes at all times and checking feet regularly.  -Mechanically debrided all nails 1-5 bilateral using sterile nail nipper and filed with dremel without incident  -Hyperkeratotic tissue debrided as well without incident. Two areas appears to be more of a wart this area was debrided and with capillary bleeding. Area treated with salycylic acid and band aid. Discussed aftercare.   -Answered all patient questions -Patient to return  in 3 months for at risk foot care -Patient advised to call the office if any problems or questions arise in the meantime.   Asberry Failing, DPM   "

## 2024-09-09 NOTE — Addendum Note (Signed)
 Addended by: Ledger Heindl R on: 09/09/2024 10:46 AM   Modules accepted: Level of Service

## 2024-09-14 ENCOUNTER — Ambulatory Visit: Admitting: Cardiology

## 2024-12-07 ENCOUNTER — Ambulatory Visit

## 2024-12-30 ENCOUNTER — Ambulatory Visit: Admitting: Neurology

## 2025-01-06 ENCOUNTER — Ambulatory Visit: Admitting: Podiatry

## 2025-08-23 ENCOUNTER — Ambulatory Visit: Admitting: Neurology
